# Patient Record
Sex: Male | Born: 1965 | Race: White | Hispanic: No | Marital: Married | Smoking: Heavy tobacco smoker
Health system: Southern US, Community
[De-identification: ages and names within clinical notes are randomized; demographics above are authoritative.]

## PROBLEM LIST (undated history)

## (undated) DIAGNOSIS — T7840XA Allergy, unspecified, initial encounter: Secondary | ICD-10-CM

## (undated) DIAGNOSIS — E785 Hyperlipidemia, unspecified: Secondary | ICD-10-CM

## (undated) DIAGNOSIS — N200 Calculus of kidney: Secondary | ICD-10-CM

## (undated) DIAGNOSIS — K279 Peptic ulcer, site unspecified, unspecified as acute or chronic, without hemorrhage or perforation: Secondary | ICD-10-CM

## (undated) DIAGNOSIS — M2550 Pain in unspecified joint: Secondary | ICD-10-CM

## (undated) DIAGNOSIS — F329 Major depressive disorder, single episode, unspecified: Secondary | ICD-10-CM

## (undated) DIAGNOSIS — E781 Pure hyperglyceridemia: Secondary | ICD-10-CM

## (undated) DIAGNOSIS — F419 Anxiety disorder, unspecified: Secondary | ICD-10-CM

## (undated) HISTORY — DX: Major depressive disorder, single episode, unspecified: F32.9

## (undated) HISTORY — DX: Allergy, unspecified, initial encounter: T78.40XA

## (undated) HISTORY — DX: Anxiety disorder, unspecified: F41.9

## (undated) HISTORY — DX: Pure hyperglyceridemia: E78.1

## (undated) HISTORY — DX: Peptic ulcer, site unspecified, unspecified as acute or chronic, without hemorrhage or perforation: K27.9

## (undated) HISTORY — DX: Hyperlipidemia, unspecified: E78.5

## (undated) HISTORY — DX: Pain in unspecified joint: M25.50

## (undated) HISTORY — DX: Calculus of kidney: N20.0

## (undated) HISTORY — PX: SHOULDER SURGERY: SHX246

---

## 2008-10-04 ENCOUNTER — Emergency Department (HOSPITAL_COMMUNITY): Admission: EM | Admit: 2008-10-04 | Discharge: 2008-10-04 | Payer: Self-pay | Admitting: Emergency Medicine

## 2010-05-10 LAB — URINALYSIS, ROUTINE W REFLEX MICROSCOPIC
Ketones, ur: 15 mg/dL — AB
Nitrite: NEGATIVE
Protein, ur: 100 mg/dL — AB
Urobilinogen, UA: 1 mg/dL (ref 0.0–1.0)

## 2010-05-10 LAB — URINE MICROSCOPIC-ADD ON

## 2010-05-10 LAB — POCT I-STAT, CHEM 8
BUN: 10 mg/dL (ref 6–23)
Chloride: 106 mEq/L (ref 96–112)
Potassium: 3.8 mEq/L (ref 3.5–5.1)
Sodium: 139 mEq/L (ref 135–145)
TCO2: 23 mmol/L (ref 0–100)

## 2010-05-10 LAB — GC/CHLAMYDIA PROBE AMP, GENITAL
Chlamydia, DNA Probe: NEGATIVE
GC Probe Amp, Genital: NEGATIVE

## 2010-12-24 IMAGING — CT CT ABDOMEN W/O CM
2 of 4 series · 14 of 32 positions shown, 19 images · non-contrast
Comparison: None

CT ABDOMEN

CLINICAL DATA: Left flank pain.

CT ABDOMEN AND PELVIS WITHOUT CONTRAST
TECHNIQUE: Multidetector CT imaging of the abdomen and pelvis was
performed following the standard protocol without intravenous
contrast.

[Series 2: stone <(id) w/o a & p (id) · axial · non-contrast · 0.84mm/px · z∈[-509,-169]mm · 8 of 88 slices shown, 13 images]
[im 10/88  soft-tissue]
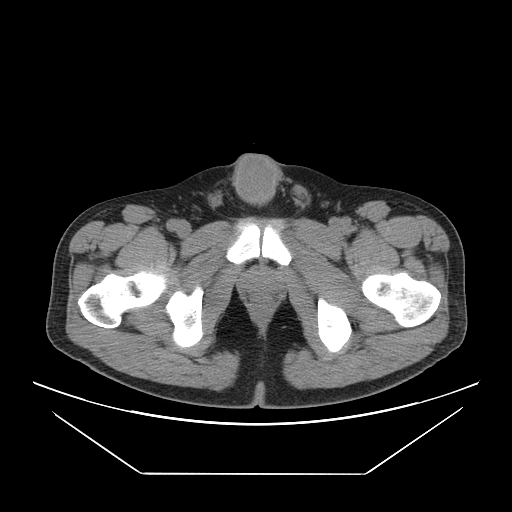
[im 10/88  bone]
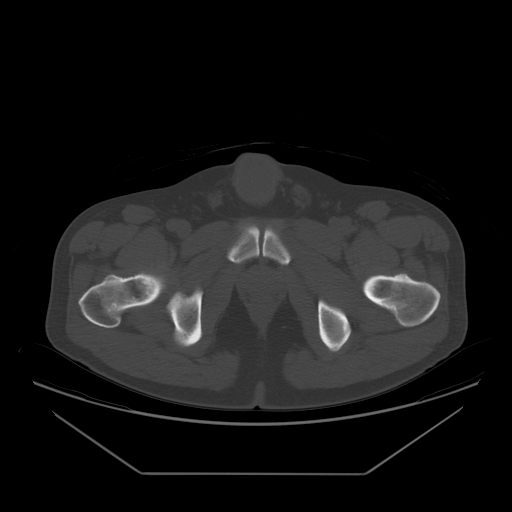
[im 20/88  soft-tissue]
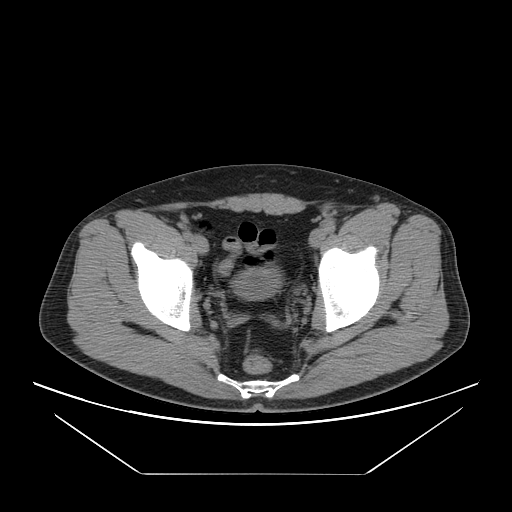
[im 30/88  soft-tissue]
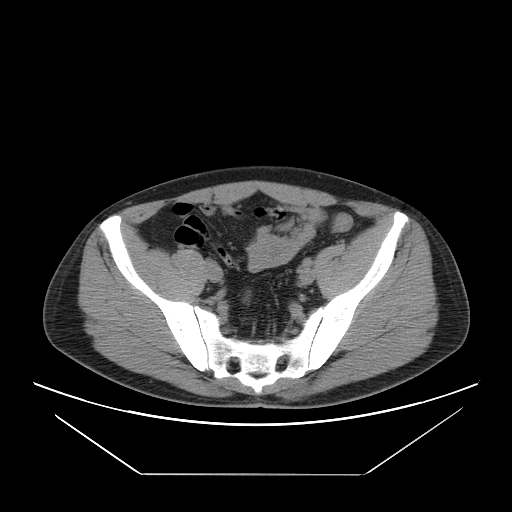
[im 39/88  soft-tissue]
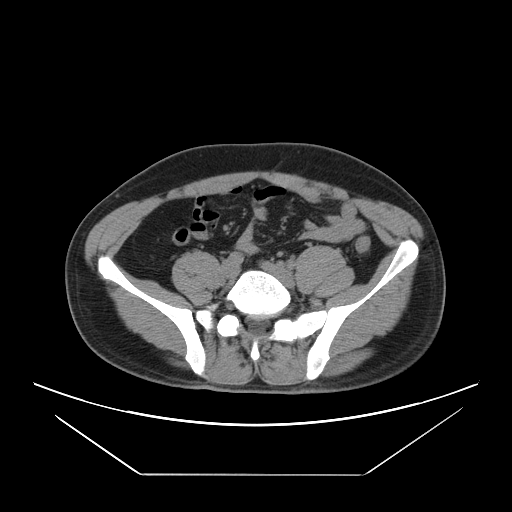
[im 49/88  soft-tissue]
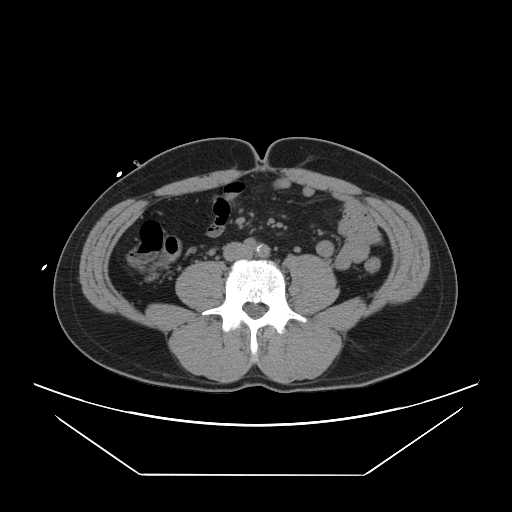
[im 49/88  lung]
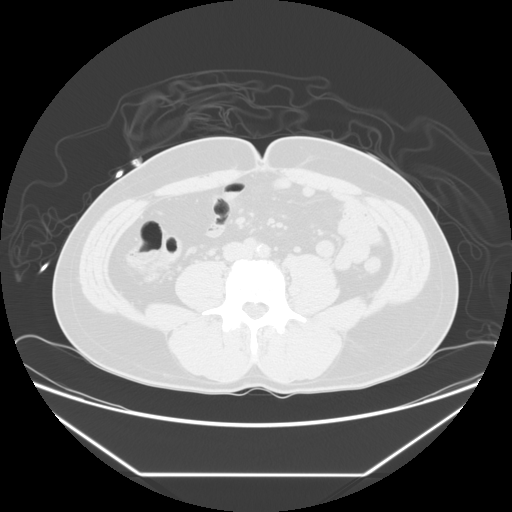
[im 59/88  soft-tissue]
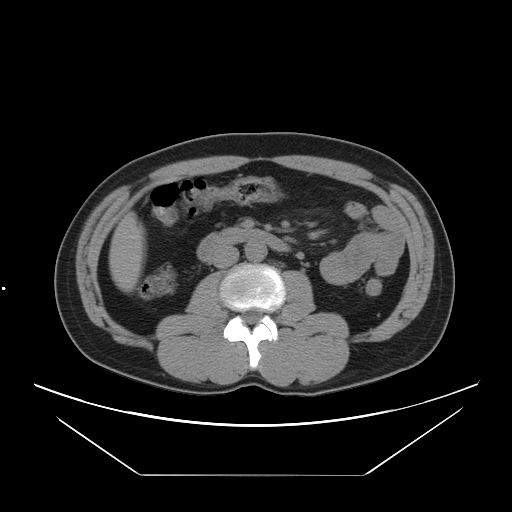
[im 59/88  lung]
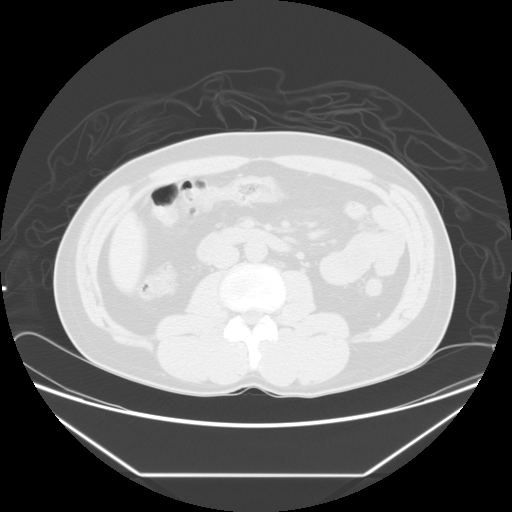
[im 68/88  soft-tissue]
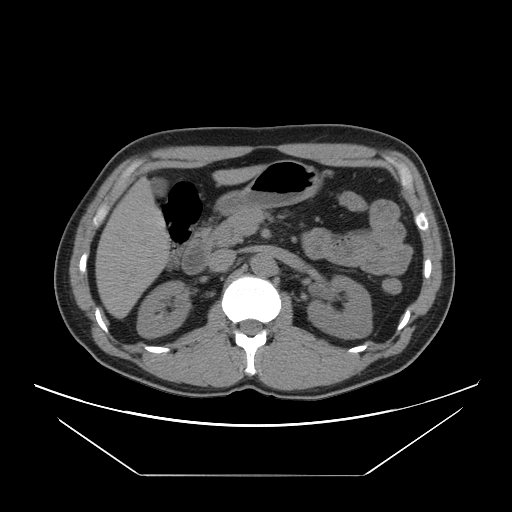
[im 68/88  lung]
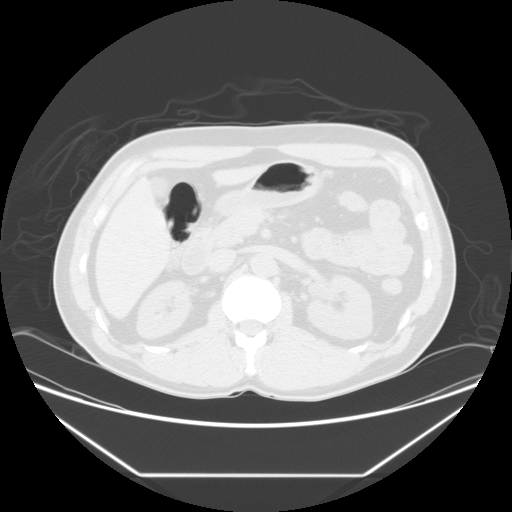
[im 78/88  soft-tissue]
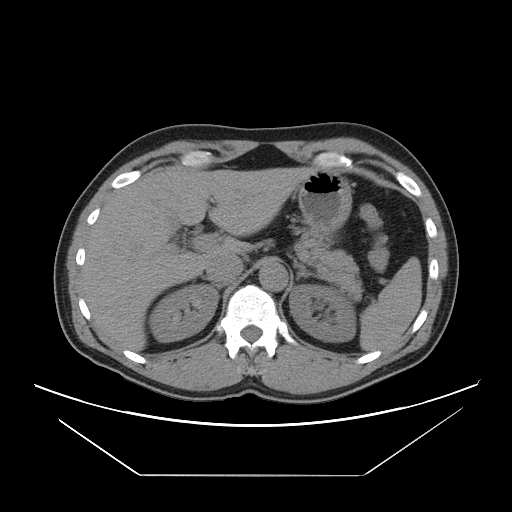
[im 78/88  lung]
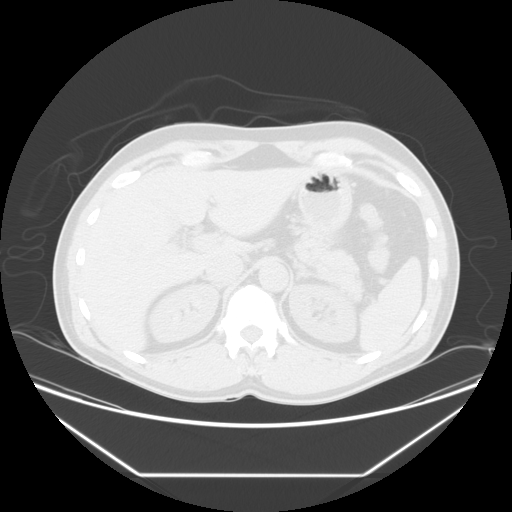

[Series 400: sag a/p · sagittal · 0.86mm/px · 6 of 102 slices shown]
[im 10/102  soft-tissue]
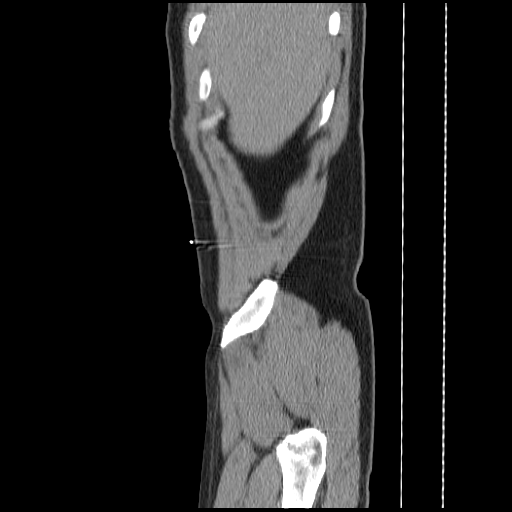
[im 19/102  soft-tissue]
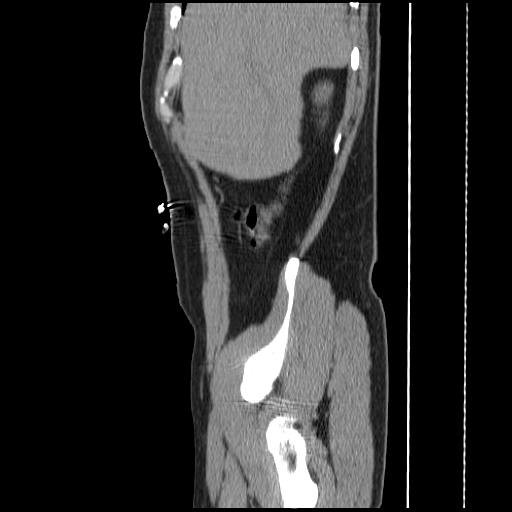
[im 37/102  soft-tissue]
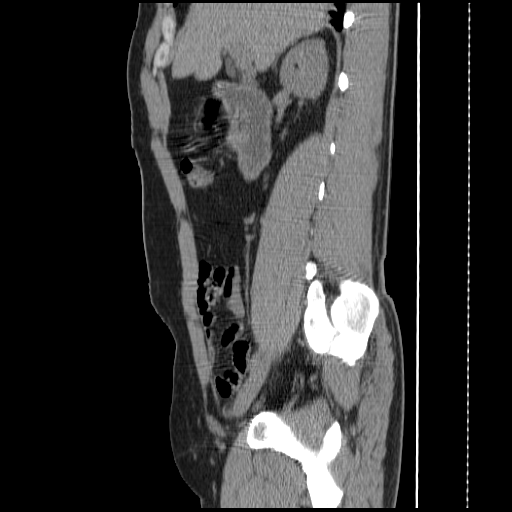
[im 46/102  soft-tissue]
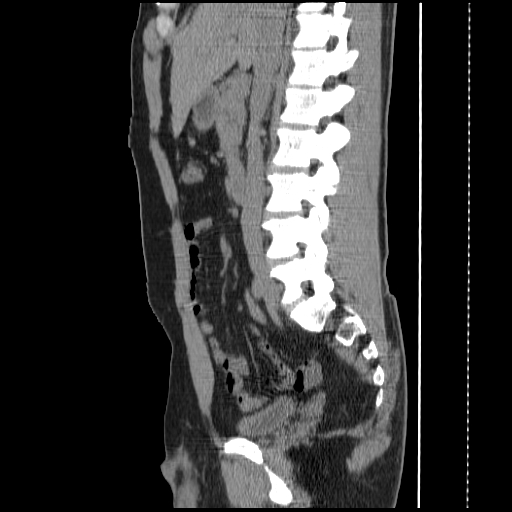
[im 56/102  soft-tissue]
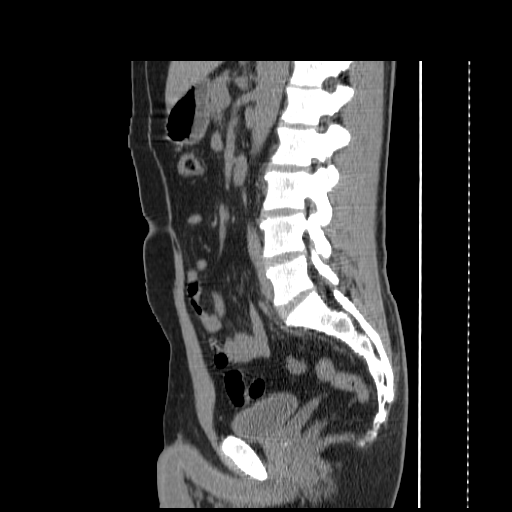
[im 65/102  soft-tissue]
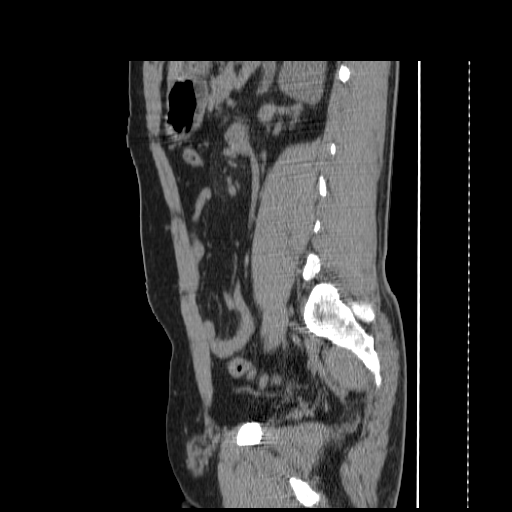

[14 of 32 positions shown; findings below may reference images not displayed]

FINDINGS: The visualized portion of the liver, spleen, pancreas,
and adrenal glands appear unremarkable in noncontrast CT
appearance.

The gallbladder and biliary system appear unremarkable.

There is mild left hydronephrosis and borderline left hydroureter
due to a 1-2 mm left ureterovesical junction calculus as shown on
image 46 of series 401.  This stone is thought to only be partially
obstructive.

No other calculi are identified.  There is some low-level
mesenteric edema eccentric to the left on images 25-37 of series
#2, with scattered small mesenteric lymph nodes.
IMPRESSION: 1.  1-2 mm left ureterovesical junction calculus is partially
obstructive, with borderline left hydronephrosis and borderline
left hydroureter.
2.  Faint mesenteric edema eccentric to the left, nonspecific and
of uncertain significance.

CT PELVIS
FINDINGS: There is a 1-2 mm left ureterovesical junction calculus.

The appendix appears normal.  No right ureteral calculus
identified.

Urinary bladder appears unremarkable.

No pathologic pelvic adenopathy is identified.
IMPRESSION: 1.  1-2 mm left ureterovesical junction calculus.

## 2013-08-16 ENCOUNTER — Telehealth: Payer: Self-pay

## 2013-08-16 ENCOUNTER — Telehealth: Payer: Self-pay | Admitting: Internal Medicine

## 2013-08-16 NOTE — Telephone Encounter (Signed)
Patient Information:  Caller Name: Anthony Woodard  Phone: [phone]  Patient: Anthony Woodard, Anthony Woodard  Gender: Male  DOB: [birth year]  Age: 48 Years  PCP: Willow OraPaz, Jose  Office Follow Up:  Does the office need to follow up with this patient?: No  Instructions For The Office: N/A  RN Note:  [facility]. Anthony Woodard made aware to proceed now to ED. Anthony Woodard states patient has already said he is not going to ED. Education provided regarding cardiac conditions/problems sometimes presenting as a subtle, nagging pain that can be dismissed by patient and/or family. Anthony Woodard appreciative of time and information provided.  Symptoms  Reason For Call & Symptoms: Generalized Joint Pain and Numbness and Tingling in Both Arms/Hands. Spouse reports she suspects anxiety attacks in addition to these symptoms though patient denies.  Reviewed Health History In EMR: Yes  Reviewed Medications In EMR: Yes  Reviewed Allergies In EMR: Yes  Reviewed Surgeries / Procedures: Yes  Date of Onset of Symptoms: Unknown  Treatments Tried: Ibuprofen PRN  Treatments Tried Worked: No  Guideline(s) Used:  Shoulder Pain  Disposition Per Guideline:   Go to ED Now  Reason For Disposition Reached:   Age > 40 and no obvious cause and pain even when not moving the arm (Exception: pain is clearly made worse by moving arm or bending neck)  Advice Given:  N/A  Patient Refused Recommendation:  Patient Will Follow Up With Office Later  Although patient refused advice to be evaluated in ED. Education was provided and ED evaluation was reinforced.

## 2013-08-16 NOTE — Telephone Encounter (Signed)
Spoke with Aundra MilletMegan, CAN who states that the patient has called with complaints of numbness down both arms associated with chest tightness. Agree with CAN to go to ED now.

## 2013-09-29 ENCOUNTER — Telehealth: Payer: Self-pay | Admitting: Internal Medicine

## 2013-09-29 ENCOUNTER — Encounter: Payer: Self-pay | Admitting: Medical

## 2013-09-29 ENCOUNTER — Ambulatory Visit (INDEPENDENT_AMBULATORY_CARE_PROVIDER_SITE_OTHER): Payer: BC Managed Care – PPO | Admitting: Medical

## 2013-09-29 VITALS — BP 120/70 | HR 80 | Temp 98.3°F | Wt 198.0 lb

## 2013-09-29 DIAGNOSIS — R05 Cough: Secondary | ICD-10-CM

## 2013-09-29 DIAGNOSIS — F17200 Nicotine dependence, unspecified, uncomplicated: Secondary | ICD-10-CM | POA: Insufficient documentation

## 2013-09-29 DIAGNOSIS — J40 Bronchitis, not specified as acute or chronic: Secondary | ICD-10-CM

## 2013-09-29 DIAGNOSIS — R062 Wheezing: Secondary | ICD-10-CM

## 2013-09-29 DIAGNOSIS — J0100 Acute maxillary sinusitis, unspecified: Secondary | ICD-10-CM

## 2013-09-29 DIAGNOSIS — R059 Cough, unspecified: Secondary | ICD-10-CM

## 2013-09-29 MED ORDER — ALBUTEROL SULFATE HFA 108 (90 BASE) MCG/ACT IN AERS: 2.0000 | INHALATION_SPRAY | Freq: Four times a day (QID) | RESPIRATORY_TRACT | Status: AC | PRN

## 2013-09-29 MED ORDER — BECLOMETHASONE DIPROPIONATE 40 MCG/ACT IN AERS: 2.0000 | INHALATION_SPRAY | Freq: Two times a day (BID) | RESPIRATORY_TRACT | Status: DC

## 2013-09-29 MED ORDER — AMOXICILLIN-POT CLAVULANATE 875-125 MG PO TABS: 1.0000 | ORAL_TABLET | Freq: Two times a day (BID) | ORAL | Status: DC

## 2013-09-29 MED ORDER — BENZONATATE 100 MG PO CAPS: 100.0000 mg | ORAL_CAPSULE | Freq: Three times a day (TID) | ORAL | Status: DC | PRN

## 2013-09-29 NOTE — Assessment & Plan Note (Signed)
Will get cxr today. Advised on possible copd in future. May already be developing. In near future consider offering agent such as wellbutrin.

## 2013-09-29 NOTE — Telephone Encounter (Signed)
°  Caller name: Coralee North from [employer]  Relation to pt: other  Call back number: [phone]   Reason for call:   pt is currently at [facility] please fax chest x-ray orders to [phone]

## 2013-09-29 NOTE — Assessment & Plan Note (Signed)
Rx of augmentin and benzonatate. cxr at [facility].

## 2013-09-29 NOTE — Patient Instructions (Addendum)
Dx- sinusitis, bronchitis, wheezing.  Plan- Rest hydrate and tylenol for fever.(or ibuprofen).  Antibiotic augmentin. Please stop plain amoxicillin. Benzonatate for cough. 2 inhalers for your wheezing. CXR at the hospital. If your symptoms worsen or change notify us. Please stop smoking. Follow up 7 days any persisting symptoms or as needed.   I later was called by [facility]. Pt went there instead of going to [facility] as instructed. By the time [facility] contact me pt did not want to get xray done. That could be one at a later date on follow up.

## 2013-09-29 NOTE — Progress Notes (Signed)
   Subjective:    Patient ID: Anthony Woodard, male    DOB: 09-Sep-1965, 48 y.o.   MRN: [id number]  HPI  Pt new. See PMH, SH, and PMH in history.  Pt drinks cokes all day long. Drink in his hand all day long. At least 20 oz a day.Maybe 40 oz a day. Wife thinks maybe amount of 2 liter. 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 Rides [employer] and likes to fish.  Pt has chronic smoker cough. But yesterday all sudden worse. Some sneezing and acute sinus pressure and chest congestion. Feels a lot of pnd. Cough is productive. No fever or chills.     Review of Systems  Constitutional: Negative for fever, chills and fatigue.  HENT: Positive for congestion, postnasal drip, rhinorrhea, sinus pressure and sneezing. Negative for ear pain, facial swelling, nosebleeds, sore throat, tinnitus and trouble swallowing.   Respiratory: Positive for cough and wheezing. Negative for chest tightness.   Cardiovascular: Negative for chest pain and palpitations.  Musculoskeletal: Positive for arthralgias.       Not new this is chronic.  Skin: Negative.   Neurological: Negative.   Hematological: Negative for adenopathy. Does not bruise/bleed easily.  Psychiatric/Behavioral: Negative.        Objective:   Physical Exam  General  Mental Status - Alert. General Appearance - Well groomed. Not in acute distress.  Skin Rashes- No Rashes.  HEENT Head- Normal. Ear Auditory Canal - Left- Normal. Right - Normal.Tympanic Membrane- Left- Normal. Right- Normal. Eye Sclera/Conjunctiva- Left- Normal. Right- Normal. Nose & Sinuses Nasal Mucosa- Left-  Boggy and Congested. Right-  Boggy and  Congested. Maxillary sinus pressure.  Mouth & Throat Lips: Upper Lip- Normal: no dryness, cracking, pallor, cyanosis, or vesicular eruption. Lower Lip-Normal: no dryness, cracking, pallor, cyanosis or vesicular eruption.(lt lower teeth poor dentition) Buccal Mucosa- Bilateral- No Aphthous ulcers. Oropharynx- No Discharge or  Erythema. Tonsils: Characteristics- Bilateral- No Erythema or Congestion. Size/Enlargement- Bilateral- No enlargement. Discharge- bilateral-None.  Neck Neck- Supple. No Masses.   Chest and Lung Exam Auscultation: Breath Sounds:-Normal, clear even and unlabored. Mild shallow. Cardiovascular Auscultation:Rythm- Regular.  Murmurs & Other Heart Sounds:Ausculatation of the heart reveal- No Murmurs.  Lymphatic Head & Neck General Head & Neck Lymphatics: Bilateral: Description- No Localized lymphadenopathy.         Assessment & Plan:

## 2013-09-29 NOTE — Assessment & Plan Note (Signed)
Rx qvar and proventil. If he worsens consider brief taper prednisone.

## 2013-09-29 NOTE — Assessment & Plan Note (Signed)
Augmentin rx. Pt can use otc nasal steroid for congestion. May have allergic component to this as well. Avoid otc afrin.

## 2013-09-29 NOTE — Progress Notes (Signed)
Pre visit review using our clinic review tool, if applicable. No additional management support is needed unless otherwise documented below in the visit note. 

## 2013-09-30 NOTE — Telephone Encounter (Signed)
Done

## 2013-10-12 ENCOUNTER — Ambulatory Visit (INDEPENDENT_AMBULATORY_CARE_PROVIDER_SITE_OTHER): Payer: BC Managed Care – PPO | Admitting: Internal Medicine

## 2013-10-12 ENCOUNTER — Ambulatory Visit (HOSPITAL_BASED_OUTPATIENT_CLINIC_OR_DEPARTMENT_OTHER)
Admission: RE | Admit: 2013-10-12 | Discharge: 2013-10-12 | Disposition: A | Discharge status: Home / Self Care | Payer: BC Managed Care – PPO | Source: Ambulatory Visit | Attending: Internal Medicine | Admitting: Internal Medicine

## 2013-10-12 ENCOUNTER — Encounter: Payer: Self-pay | Admitting: Internal Medicine

## 2013-10-12 VITALS — BP 110/70 | HR 83 | Temp 98.2°F | Ht 72.0 in | Wt 200.5 lb

## 2013-10-12 DIAGNOSIS — F17200 Nicotine dependence, unspecified, uncomplicated: Secondary | ICD-10-CM

## 2013-10-12 DIAGNOSIS — R079 Chest pain, unspecified: Secondary | ICD-10-CM | POA: Insufficient documentation

## 2013-10-12 DIAGNOSIS — Z87891 Personal history of nicotine dependence: Secondary | ICD-10-CM | POA: Diagnosis not present

## 2013-10-12 DIAGNOSIS — M2550 Pain in unspecified joint: Secondary | ICD-10-CM

## 2013-10-12 DIAGNOSIS — R0609 Other forms of dyspnea: Secondary | ICD-10-CM

## 2013-10-12 DIAGNOSIS — R0989 Other specified symptoms and signs involving the circulatory and respiratory systems: Secondary | ICD-10-CM

## 2013-10-12 DIAGNOSIS — R0602 Shortness of breath: Secondary | ICD-10-CM | POA: Insufficient documentation

## 2013-10-12 DIAGNOSIS — G8929 Other chronic pain: Secondary | ICD-10-CM

## 2013-10-12 DIAGNOSIS — R0789 Other chest pain: Secondary | ICD-10-CM | POA: Insufficient documentation

## 2013-10-12 DIAGNOSIS — F32A Depression, unspecified: Secondary | ICD-10-CM

## 2013-10-12 DIAGNOSIS — F329 Major depressive disorder, single episode, unspecified: Secondary | ICD-10-CM | POA: Insufficient documentation

## 2013-10-12 DIAGNOSIS — F419 Anxiety disorder, unspecified: Secondary | ICD-10-CM

## 2013-10-12 DIAGNOSIS — F341 Dysthymic disorder: Secondary | ICD-10-CM

## 2013-10-12 HISTORY — DX: Anxiety disorder, unspecified: F41.9

## 2013-10-12 HISTORY — DX: Depression, unspecified: F32.A

## 2013-10-12 MED ORDER — ESCITALOPRAM OXALATE 10 MG PO TABS: 10.0000 mg | ORAL_TABLET | Freq: Every day | ORAL | Status: DC

## 2013-10-12 NOTE — Assessment & Plan Note (Signed)
Smokes 2 packs a day since age 48,  He is extremely high risk for cancer, CAD, strokes and emphysema. Patient is very aware of these risks. Recommend to see a dentist regularly Will treat anxiety and depression first, then consider Wellbutrin Recommend to start thinking in terms of quitting, nicotine patch

## 2013-10-12 NOTE — Assessment & Plan Note (Signed)
Multiple joint pains without evidence of synovitis on exam. DJD? Inflammatory arthritis? Fibromyalgia?  Plan--labs, see instructions

## 2013-10-12 NOTE — Patient Instructions (Signed)
Stop by the front desk and schedule labs to be done tomorrow (fasting) CMP, CBC, TSH, FLP --- Chest pain Sed rate , total CK-- dx joint pain  Stop by the first floor and get your chest x-ray  Medicines  Start aspirin 81 mg one tablet daily Start Lexapro 10 mg: The first 10 days take half tablet  at night, then take one tablet daily Start taking Prilosec 20 mg OTC 2 tablets every morning before breakfast   Please come back to the office in 3 weeks  for a routine office visit

## 2013-10-12 NOTE — Progress Notes (Signed)
Subjective:    Patient ID: Anthony Woodard, male    DOB: July 23, 1965, 48 y.o.   MRN: [id number]  DOS:  10/12/2013 Type of visit - description : new pt to me, here w/ wife, has several issues   arthralgias: States that he hurt in all his joints all the time for the last 15 years. Has not seen any M.D. before for these symptoms, has not noticed any swelling or redness in his wrists or hands. I notice that he is allergic to hydrocodone and is not taking any pain medication at this time. Symptoms gradually worse since he had shoulder surgery a few years ago.  Anxiety: wife is here, she reports that he is very anxious all the time since she met him, "high strung". He gets upset very easily, " carries everything on his shoulders". When asked, admits to occasional depression but no suicidal thoughts. Never taken any medication for his symptoms.  Dyspnea on exertion: For the last 4-5 years he notice DOEon and off, symptoms are not always consistent but sometimes it takes very little for him to get SOB. He smokes 2 pack a day for many years, has a "smoker's cough":  daily morning cough which is dry. At some point in the remote past he saw blood in the sputum one time   but not recently Recently  seen with bronchitis, bronchospasm, status post antibiotic, he is getting better from that acute infection.  When asked, admits to chest pain: He said it is all the time , Sharp, sometimes pressure. Not necessarily worse with exertion.  ROS Denies any fever chills or weight loss. Reports headache "all the time" mild, occasional exacerbation .    Past Medical History  Diagnosis Date  . Kidney stones     Lt side.  . Arthralgia     All over.  . Allergy   . Anxiety and depression 10/12/2013    Past Surgical History  Procedure Laterality Date  . Shoulder surgery Right Biceps tendon tear. And shoulder repair.    History   Social History  . Marital Status: Married    Spouse Name: N/A    Number of  Children: 4  . Years of Education: N/A   Occupational History  . machinery , driver     Social History Main Topics  . Smoking status: Heavy Tobacco Smoker -- 2.00 packs/day for 27 years    Types: Cigarettes  . Smokeless tobacco: Never Used     Comment: started at age 82, 2 ppd   . Alcohol Use: 0.6 oz/week    1 Cans of beer per week     Comment: rare   . Drug Use: No  . Sexual Activity: Yes   Other Topics Concern  . Not on file   Social History Narrative  . No narrative on file     Family History  Problem Relation Age of Onset  . Stroke Father   . CAD Sister     stent at age 39s  . Diabetes Brother   . Colon cancer Neg Hx   . Prostate cancer Neg Hx   . COPD Mother   . Ovarian cancer Sister        Medication List       This list is accurate as of: 10/12/13 11:59 PM.  Always use your most recent med list.               albuterol 108 (90 BASE) MCG/ACT inhaler  Commonly known  as:  PROVENTIL HFA;VENTOLIN HFA  Inhale 2 puffs into the lungs every 6 (six) hours as needed for wheezing or shortness of breath.     escitalopram 10 MG tablet  Commonly known as:  LEXAPRO  Take 1 tablet (10 mg total) by mouth daily.     ibuprofen 800 MG tablet  Commonly known as:  ADVIL,MOTRIN  Take 800 mg by mouth every 8 (eight) hours as needed.     traMADol 50 MG tablet  Commonly known as:  ULTRAM  Take by mouth every 6 (six) hours as needed.           Objective:   Physical Exam BP 110/70  Pulse 83  Temp(Src) 98.2 F (36.8 C) (Oral)  Ht 6' (1.829 m)  Wt 200 lb 8 oz (90.946 kg)  BMI 27.19 kg/m2  SpO2 98% General -- alert, well-developed, NAD.  Neck --no thyromegaly , no  LAD HEENT-- Not pale.  Lungs -- normal respiratory effort, no intercostal retractions, no accessory muscle use, and normal breath sounds.  Heart-- normal rate, regular rhythm, no murmur.  Abdomen-- Not distended, good bowel sounds,soft, non-tender. Extremities-- no pretibial edema bilaterally ;  Wrists and hands without synovitis on exam Neurologic--  alert & oriented X3. Speech normal, gait appropriate for age, strength symmetric and appropriate for age.    Psych-- Cognition and judgment appear intact. Cooperative with normal attention span and concentration. No anxious or depressed appearing, He actually seems to be in good spirits     Assessment & Plan:    48 year old gentleman presents with multiple symptoms, mostly been chronic and happening "all the time".  Also GERD-- start PPI  Today , I spent more than 45   min with the patient: >50% of the time counseling regards anxiety and assesing a number of chronic symptoms. Multiple questions from the patient and his wife were answer

## 2013-10-12 NOTE — Assessment & Plan Note (Addendum)
Long history of anxiety and occasional depression, recommend counseling (declined). We talk about medication, he agreed to try lexapro

## 2013-10-12 NOTE — Assessment & Plan Note (Addendum)
smoker with chronic dry cough or family history of CAD and COPD presents with dyspnea on exertion. Differential diagnosis is large but includes COPD, CAD versus others. Plan:  EKG-- nsr  Chest x-ray Labs including a cholesterol panel for risk stratification PFTs Tobacco cessation Aspirin-- 81 mg 1 qd, watch  GI s/e Stress test, pt somehow reluctant to proceed thus will refer to cards

## 2013-10-12 NOTE — Progress Notes (Signed)
Pre visit review using our clinic review tool, if applicable. No additional management support is needed unless otherwise documented below in the visit note. 

## 2013-10-13 ENCOUNTER — Other Ambulatory Visit (INDEPENDENT_AMBULATORY_CARE_PROVIDER_SITE_OTHER): Payer: BC Managed Care – PPO

## 2013-10-13 ENCOUNTER — Ambulatory Visit: Payer: BC Managed Care – PPO

## 2013-10-13 DIAGNOSIS — R0789 Other chest pain: Secondary | ICD-10-CM

## 2013-10-13 DIAGNOSIS — G8929 Other chronic pain: Secondary | ICD-10-CM

## 2013-10-13 DIAGNOSIS — M2550 Pain in unspecified joint: Secondary | ICD-10-CM

## 2013-10-13 LAB — LIPID PANEL
CHOLESTEROL: 328 mg/dL — AB (ref 0–200)
HDL: 23 mg/dL — ABNORMAL LOW (ref >39)
Total CHOL/HDL Ratio: 14.3 Ratio
Triglycerides: 2508 mg/dL — ABNORMAL HIGH (ref <150)

## 2013-10-13 LAB — CBC WITH DIFFERENTIAL/PLATELET
BASOS PCT: 0.3 % (ref 0.0–3.0)
Basophils Absolute: 0 10*3/uL (ref 0.0–0.1)
EOS ABS: 0.2 10*3/uL (ref 0.0–0.7)
EOS PCT: 1.6 % (ref 0.0–5.0)
HEMATOCRIT: 49.2 % (ref 39.0–52.0)
Hemoglobin: 17.5 g/dL — ABNORMAL HIGH (ref 13.0–17.0)
LYMPHS ABS: 2.4 10*3/uL (ref 0.7–4.0)
Lymphocytes Relative: 21.8 % (ref 12.0–46.0)
MCHC: 35.5 g/dL (ref 30.0–36.0)
MCV: 87 fl (ref 78.0–100.0)
MONO ABS: 0.6 10*3/uL (ref 0.1–1.0)
Monocytes Relative: 5.6 % (ref 3.0–12.0)
NEUTROS PCT: 70.7 % (ref 43.0–77.0)
Neutro Abs: 7.8 10*3/uL — ABNORMAL HIGH (ref 1.4–7.7)
PLATELETS: 213 10*3/uL (ref 150.0–400.0)
RBC: 5.65 Mil/uL (ref 4.22–5.81)
RDW: 13.4 % (ref 11.5–15.5)
WBC: 11 10*3/uL — ABNORMAL HIGH (ref 4.0–10.5)

## 2013-10-13 LAB — COMPLETE METABOLIC PANEL WITH GFR
ALBUMIN: 5 g/dL (ref 3.5–5.2)
ALK PHOS: 111 U/L (ref 39–117)
ALT: 76 U/L — ABNORMAL HIGH (ref 0–53)
AST: 64 U/L — ABNORMAL HIGH (ref 0–37)
BUN: 21 mg/dL (ref 6–23)
CO2: 23 meq/L (ref 19–32)
Calcium: 10.3 mg/dL (ref 8.4–10.5)
Chloride: 100 mEq/L (ref 96–112)
Creat: 1.88 mg/dL — ABNORMAL HIGH (ref 0.50–1.35)
GFR, EST AFRICAN AMERICAN: 48 mL/min — AB
GFR, EST NON AFRICAN AMERICAN: 42 mL/min — AB
GLUCOSE: 81 mg/dL (ref 70–99)
POTASSIUM: 4.6 meq/L (ref 3.5–5.3)
SODIUM: 134 meq/L — AB (ref 135–145)
TOTAL PROTEIN: 8.2 g/dL (ref 6.0–8.3)
Total Bilirubin: 0.7 mg/dL (ref 0.2–1.2)

## 2013-10-13 LAB — TSH: TSH: 1.678 u[IU]/mL (ref 0.350–4.500)

## 2013-10-13 LAB — SEDIMENTATION RATE: SED RATE: 3 mm/h (ref 0–22)

## 2013-10-14 ENCOUNTER — Telehealth: Payer: Self-pay | Admitting: Internal Medicine

## 2013-10-14 DIAGNOSIS — R7989 Other specified abnormal findings of blood chemistry: Secondary | ICD-10-CM

## 2013-10-14 DIAGNOSIS — R945 Abnormal results of liver function studies: Secondary | ICD-10-CM

## 2013-10-14 LAB — CK TOTAL AND CKMB (NOT AT ARMC)
CK TOTAL: 106 U/L (ref 7–232)
CK, MB: <0.7 ng/mL (ref 0.0–5.0)

## 2013-10-14 MED ORDER — FENOFIBRATE 40 MG PO TABS: 40.0000 mg | ORAL_TABLET | Freq: Every day | ORAL | Status: DC

## 2013-10-14 NOTE — Telephone Encounter (Signed)
Advise patient: His kidney function is decreased, liver tests elevated, triglycerides extremity high. In addition to that we have already ordered, needs further evaluation (hemochromatosis ?, MM?) Plan: Come back fasting for additional labs:  Hepatitis  serology , iron, ferritin, transferring saturation   abdominal ultrasound to assess kidney and liver  Fenofibrate, low dose (orders in) RTC 3 weeks as recommended

## 2013-10-17 NOTE — Telephone Encounter (Signed)
Spoke with Pts wife Danford Bad, left my information to have Pt return my call.

## 2013-10-17 NOTE — Telephone Encounter (Signed)
Spoke with Pt, gave him results, lab appt 10/20/2013 at 8:15, instructed Pt to get abdominal ultrasound, which he stated would probably be done on 9/17 also, and instructed him to take fenofibrate .

## 2013-10-19 ENCOUNTER — Other Ambulatory Visit (HOSPITAL_BASED_OUTPATIENT_CLINIC_OR_DEPARTMENT_OTHER): Payer: BC Managed Care – PPO

## 2013-10-19 NOTE — Telephone Encounter (Signed)
Please advise 

## 2013-10-19 NOTE — Telephone Encounter (Signed)
Pt would like for you to call him back in regards to his ultra sound appt, pt states he needs to know if it is a must that he has the procedure done. States he is not going to pay $400 to have the ultra sound states if its not life threatening he does not want to pay that much money.

## 2013-10-20 ENCOUNTER — Ambulatory Visit (HOSPITAL_BASED_OUTPATIENT_CLINIC_OR_DEPARTMENT_OTHER): Payer: BC Managed Care – PPO

## 2013-10-20 ENCOUNTER — Other Ambulatory Visit: Payer: BC Managed Care – PPO

## 2013-10-20 NOTE — Telephone Encounter (Signed)
Caller name: Tylerjames  Call back number: [phone]    Reason for call:   Pt has questions about lab apts, and antibiotics.

## 2013-10-20 NOTE — Telephone Encounter (Signed)
We can hold off on the ultrasound, we can discuss more when he comes back in approximately 2 weeks

## 2013-10-20 NOTE — Telephone Encounter (Signed)
Tried returning Pt call. No answer, no answer machine set up.

## 2013-10-20 NOTE — Telephone Encounter (Signed)
Spoke with Pt, he is concerned that his tooth infection may be causing the elevated lab results. He stated he has been on antibiotics for 4 weeks and does not return to dentist until 10/29. His next appt with you is 9/30. He wanted to know if it would be okay to reschedule 9/30 appt until after 10/29 because of the antibiotic usage and tooth infection.

## 2013-10-21 NOTE — Telephone Encounter (Signed)
Ok, will see him ~ 12-01-13

## 2013-10-21 NOTE — Telephone Encounter (Signed)
LMOM for Pt to return call.  

## 2013-10-21 NOTE — Telephone Encounter (Signed)
Spoke with Pt, rescheduled for F/U on 11/20 at 4 pm.

## 2013-11-02 ENCOUNTER — Ambulatory Visit: Payer: BC Managed Care – PPO | Admitting: Internal Medicine

## 2013-11-03 ENCOUNTER — Ambulatory Visit (INDEPENDENT_AMBULATORY_CARE_PROVIDER_SITE_OTHER): Payer: BC Managed Care – PPO | Admitting: Internal Medicine

## 2013-11-03 DIAGNOSIS — R0609 Other forms of dyspnea: Secondary | ICD-10-CM

## 2013-11-03 LAB — PULMONARY FUNCTION TEST
DL/VA % PRED: 90 %
DL/VA: 4.26 ml/min/mmHg/L
DLCO UNC % PRED: 78 %
DLCO UNC: 26.34 ml/min/mmHg
FEF 25-75 Post: 2.58 L/sec
FEF 25-75 Pre: 1.64 L/sec
FEF2575-%Change-Post: 57 %
FEF2575-%PRED-PRE: 43 %
FEF2575-%Pred-Post: 69 %
FEV1-%Change-Post: 14 %
FEV1-%Pred-Post: 74 %
FEV1-%Pred-Pre: 64 %
FEV1-POST: 3.08 L
FEV1-Pre: 2.69 L
FEV1FVC-%CHANGE-POST: 7 %
FEV1FVC-%Pred-Pre: 85 %
FEV6-%CHANGE-POST: 7 %
FEV6-%PRED-POST: 83 %
FEV6-%PRED-PRE: 77 %
FEV6-POST: 4.28 L
FEV6-PRE: 3.98 L
FEV6FVC-%CHANGE-POST: 0 %
FEV6FVC-%PRED-POST: 102 %
FEV6FVC-%Pred-Pre: 102 %
FVC-%CHANGE-POST: 6 %
FVC-%PRED-POST: 80 %
FVC-%PRED-PRE: 75 %
FVC-POST: 4.29 L
FVC-PRE: 4.02 L
POST FEV6/FVC RATIO: 100 %
PRE FEV1/FVC RATIO: 67 %
Post FEV1/FVC ratio: 72 %
Pre FEV6/FVC Ratio: 99 %
RV % pred: 87 %
RV: 1.77 L
TLC % PRED: 80 %
TLC: 5.76 L

## 2013-11-03 NOTE — Progress Notes (Signed)
PFT done today. 

## 2013-11-21 NOTE — Progress Notes (Signed)
HPI: 48 year old male for evaluation of chest pain and dyspnea. No prior cardiac history. 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 The patient states he has had dyspnea on exertion for approximately 5 years. There is no orthopnea, PND, pedal edema, palpitations or syncope. He also has occasional chest discomfort. It can be in the left or right chest area. It increases after certain foods and lying flat. No radiation. No associated symptoms. He did not have exertional chest pain. He was recently noted to have abnormal renal function, increased liver functions and extremely high cholesterol. Cardiology asked to evaluate.  Current Outpatient Prescriptions  Medication Sig Dispense Refill  . albuterol (PROVENTIL HFA;VENTOLIN HFA) 108 (90 BASE) MCG/ACT inhaler Inhale 2 puffs into the lungs every 6 (six) hours as needed for wheezing or shortness of breath.  1 Inhaler  1  . amoxicillin (AMOXIL) 500 MG capsule Take 1 capsule by mouth daily.      Marland Kitchen. aspirin EC 81 MG tablet Take 81 mg by mouth daily.      Marland Kitchen. escitalopram (LEXAPRO) 10 MG tablet Take 1 tablet (10 mg total) by mouth daily.  30 tablet  1  . ibuprofen (ADVIL,MOTRIN) 800 MG tablet Take 800 mg by mouth every 8 (eight) hours as needed.      Marland Kitchen. omeprazole (PRILOSEC) 20 MG capsule Take 40 mg by mouth daily.       No current facility-administered medications for this visit.    Allergies  Allergen Reactions  . Hydrocodone Other (See Comments)    Alters mental status . Pt states gets mood swings.   Shon Hale. Qvar [Beclomethasone]     Sweats , almost pass out    Past Medical History  Diagnosis Date  . Kidney stones     Lt side.  . Arthralgia     All over.  . Allergy   . Anxiety and depression 10/12/2013    Past Surgical History  Procedure Laterality Date  . Shoulder surgery Right Biceps tendon tear. And shoulder repair.    History   Social History  . Marital Status: Married    Spouse Name: N/A   Number of Children: 4  . Years of Education: N/A   Occupational History  . machinery , driver     Social History Main Topics  . Smoking status: Heavy Tobacco Smoker -- 2.00 packs/day for 27 years    Types: Cigarettes  . Smokeless tobacco: Never Used     Comment: started at age 48, 2 ppd   . Alcohol Use: 0.6 oz/week    1 Cans of beer per week     Comment: rare   . Drug Use: No  . Sexual Activity: Yes   Other Topics Concern  . Not on file   Social History Narrative  . No narrative on file    Family History  Problem Relation Age of Onset  . Stroke Father   . CAD Sister     stent at age 5550s  . Diabetes Brother   . Colon cancer Neg Hx   . Prostate cancer Neg Hx   . COPD Mother   . Ovarian cancer Sister     ROS: no fevers or chills, productive cough, hemoptysis, dysphasia, odynophagia, melena, hematochezia, dysuria, hematuria, rash, seizure activity, orthopnea, PND, pedal edema, claudication. Remaining systems are negative.  Physical Exam:   Blood pressure 130/84, pulse 91, height 6' (1.829 m), weight 200 lb 3.2 oz (90.81 kg).  General:  Well developed/well  nourished in NAD Skin warm/dry Patient not depressed No peripheral clubbing Back-normal HEENT-normal/normal eyelids Neck supple/normal carotid upstroke bilaterally; no bruits; no JVD; no thyromegaly chest - CTA/ normal expansion CV - RRR/normal S1 and S2; no murmurs, rubs or gallops;  PMI nondisplaced Abdomen -NT/ND, no HSM, no mass, + bowel sounds, no bruit 2+ femoral pulses, no bruits Ext-no edema, chords, 2+ DP Neuro-grossly nonfocal  ECG 10/12/2013-sinus rhythm with no ST changes.

## 2013-11-22 ENCOUNTER — Encounter: Payer: Self-pay | Admitting: *Deleted

## 2013-11-22 ENCOUNTER — Encounter: Payer: Self-pay | Admitting: Cardiology

## 2013-11-22 ENCOUNTER — Ambulatory Visit (INDEPENDENT_AMBULATORY_CARE_PROVIDER_SITE_OTHER): Payer: BC Managed Care – PPO | Admitting: Cardiology

## 2013-11-22 VITALS — BP 130/84 | HR 91 | Ht 72.0 in | Wt 200.2 lb

## 2013-11-22 DIAGNOSIS — N289 Disorder of kidney and ureter, unspecified: Secondary | ICD-10-CM

## 2013-11-22 DIAGNOSIS — R7989 Other specified abnormal findings of blood chemistry: Secondary | ICD-10-CM

## 2013-11-22 DIAGNOSIS — R0609 Other forms of dyspnea: Secondary | ICD-10-CM

## 2013-11-22 DIAGNOSIS — E785 Hyperlipidemia, unspecified: Secondary | ICD-10-CM

## 2013-11-22 DIAGNOSIS — R945 Abnormal results of liver function studies: Secondary | ICD-10-CM

## 2013-11-22 DIAGNOSIS — R0789 Other chest pain: Secondary | ICD-10-CM

## 2013-11-22 DIAGNOSIS — F17200 Nicotine dependence, unspecified, uncomplicated: Secondary | ICD-10-CM

## 2013-11-22 DIAGNOSIS — R0600 Dyspnea, unspecified: Secondary | ICD-10-CM

## 2013-11-22 DIAGNOSIS — Z720 Tobacco use: Secondary | ICD-10-CM

## 2013-11-22 NOTE — Assessment & Plan Note (Signed)
Symptoms are atypical and may be related to GI. I will arrange an exercise treadmill for risk stratification.

## 2013-11-22 NOTE — Patient Instructions (Signed)
Your physician recommends that you schedule a follow-up appointment in: AS NEEDED PENDING TEST RESULTS  Your physician has requested that you have an echocardiogram. Echocardiography is a painless test that uses sound waves to create images of your heart. It provides your doctor with information about the size and shape of your heart and how well your heart's chambers and valves are working. This procedure takes approximately one hour. There are no restrictions for this procedure.   Your physician has requested that you have an exercise tolerance test. For further information please visit [email or web address]. Please also follow instruction sheet, as given.    Exercise Stress Electrocardiogram An exercise stress electrocardiogram is a test to check how blood flows to your heart. It is done to find areas of poor blood flow. You will need to walk on a treadmill for this test. The electrocardiogram will record your heartbeat when you are at rest and when you are exercising. BEFORE THE PROCEDURE  Do not have drinks with caffeine or foods with caffeine for 24 hours before the test, or as told by your doctor. This includes coffee, tea (even decaf tea), sodas, chocolate, and cocoa.  Follow your doctor's instructions about eating and drinking before the test.  Ask your doctor what medicines you should or should not take before the test. Take your medicines with water unless told by your doctor not to.  If you use an inhaler, bring it with you to the test.  Bring a snack to eat after the test.  Do not  smoke for 4 hours before the test.  Do not put lotions, powders, creams, or oils on your chest before the test.  Wear comfortable shoes and clothing. PROCEDURE  You will have patches put on your chest. Small areas of your chest may need to be shaved. Wires will be connected to the patches.  Your heart rate will be watched while you are resting and while you are exercising.  You will walk on the  treadmill. The treadmill will slowly get faster to raise your heart rate.  The test will take about 1-2 hours. AFTER THE PROCEDURE  Your heart rate and blood pressure will be watched after the test.  You may return to your normal diet, activities, and medicines or as told by your doctor. Document Released: 07/09/2007 Document Revised: 06/06/2013 Document Reviewed: 09/27/2012 Exit[employer] Patient Information 2015 [employer]. This information is not intended to replace advice given to you by your health care provider. Make sure you discuss any questions you have with your health care provider.   

## 2013-11-22 NOTE — Assessment & Plan Note (Signed)
Most likely there is a contribution from COPD. I will schedule an echocardiogram to assess LV function.

## 2013-11-22 NOTE — Assessment & Plan Note (Signed)
Patient counseled on discontinuing. 

## 2013-11-22 NOTE — Assessment & Plan Note (Signed)
I have encouraged him to followup with primary care for this issue.

## 2013-11-22 NOTE — Assessment & Plan Note (Addendum)
Patient's cholesterol is extremely high. His triglycerides are also high. Primary care is managing this. It may be worthwhile for him to be seen in the lipid clinic given the severity of his labs. I will leave this to her primary care. He may also benefit from a statin.

## 2013-11-22 NOTE — Assessment & Plan Note (Signed)
DC Motrin. Followup primary care.

## 2013-11-23 ENCOUNTER — Encounter: Payer: Self-pay | Admitting: Internal Medicine

## 2013-11-23 ENCOUNTER — Telehealth: Payer: Self-pay | Admitting: Internal Medicine

## 2013-11-23 NOTE — Telephone Encounter (Signed)
Advise patient, Chest x-ray showed normal lungs PFTs showed some obstruction meaning wheezing. Quitting  tobacco and use albuterol as needed is the best thing he can do.

## 2013-11-24 NOTE — Telephone Encounter (Signed)
Pt informed via MyChart of cxr results and PFTs results.

## 2013-12-13 ENCOUNTER — Telehealth (HOSPITAL_COMMUNITY): Payer: Self-pay

## 2013-12-13 NOTE — Telephone Encounter (Signed)
Encounter complete. 

## 2013-12-14 ENCOUNTER — Encounter: Payer: Self-pay | Admitting: Internal Medicine

## 2013-12-15 ENCOUNTER — Telehealth: Payer: Self-pay | Admitting: Internal Medicine

## 2013-12-15 ENCOUNTER — Ambulatory Visit (HOSPITAL_BASED_OUTPATIENT_CLINIC_OR_DEPARTMENT_OTHER)
Admission: RE | Admit: 2013-12-15 | Discharge: 2013-12-15 | Disposition: A | Discharge status: Home / Self Care | Payer: BC Managed Care – PPO | Source: Ambulatory Visit | Attending: Cardiology | Admitting: Cardiology

## 2013-12-15 ENCOUNTER — Ambulatory Visit (HOSPITAL_COMMUNITY)
Admission: RE | Admit: 2013-12-15 | Discharge: 2013-12-15 | Disposition: A | Discharge status: Home / Self Care | Payer: BC Managed Care – PPO | Source: Ambulatory Visit | Attending: Cardiology | Admitting: Cardiology

## 2013-12-15 DIAGNOSIS — R079 Chest pain, unspecified: Secondary | ICD-10-CM | POA: Diagnosis not present

## 2013-12-15 DIAGNOSIS — R0600 Dyspnea, unspecified: Secondary | ICD-10-CM

## 2013-12-15 DIAGNOSIS — I517 Cardiomegaly: Secondary | ICD-10-CM

## 2013-12-15 MED ORDER — ESCITALOPRAM OXALATE 20 MG PO TABS: 20.0000 mg | ORAL_TABLET | Freq: Every day | ORAL | Status: DC

## 2013-12-15 NOTE — Procedures (Signed)
Exercise Treadmill Test  Test  Exercise Tolerance Test Ordering MD: Olga MillersBrian Khadija Thier, MD  Interpreting MD:   Unique Test No: 1  Treadmill:  1  Indication for ETT: chest pain - rule out ischemia  Contraindication to ETT: No   Stress Modality: exercise - treadmill  Cardiac Imaging Performed: non   Protocol: standard Bruce - maximal  Max BP:  182/90  Max MPHR (bpm):  174 85% MPR (bpm):  148  MPHR obtained (bpm): 151 % MPHR obtained: 86  Reached 85% MPHR (min:sec): 9:20 Total Exercise Time (min-sec):9:41  Workload in METS:  11.20 Borg Scale:   Reason ETT Terminated:  dyspnea,  leg fatigue    ST Segment Analysis At Rest: NSR, lateral MI, cannot R/O septal MI With Exercise: no evidence of significant ST depression  Other Information Arrhythmia:  No Angina during ETT:  absent (0) Quality of ETT:  diagnostic  ETT Interpretation:  normal - no evidence of ischemia by ST analysis  Comments: ETT with good exercise tolerance (9:41); normal BP response; no chest pain; no ST changes; negative adequate ETT.  Olga MillersBrian Jakeia Carreras

## 2013-12-15 NOTE — Telephone Encounter (Signed)
Please see patient's email, plan: Increase Lexapro  To 20 mg one tablet by mouth daily #30 no refills. Schedule office visit within 2 weeks

## 2013-12-15 NOTE — Progress Notes (Signed)
2D Echocardiogram Complete.  12/15/2013   Annleigh Knueppel, RDCS  

## 2013-12-15 NOTE — Telephone Encounter (Signed)
Spoke with Pt informed him to increase Lexapro to 20 mg 1 tab po qd. Pt has F/U appt scheduled for 11/20 at 4. [facility]

## 2013-12-23 ENCOUNTER — Encounter: Payer: Self-pay | Admitting: Internal Medicine

## 2013-12-23 ENCOUNTER — Ambulatory Visit (INDEPENDENT_AMBULATORY_CARE_PROVIDER_SITE_OTHER): Payer: BC Managed Care – PPO | Admitting: Internal Medicine

## 2013-12-23 VITALS — BP 124/82 | HR 74 | Temp 97.8°F | Wt 203.5 lb

## 2013-12-23 DIAGNOSIS — R7989 Other specified abnormal findings of blood chemistry: Secondary | ICD-10-CM

## 2013-12-23 DIAGNOSIS — N289 Disorder of kidney and ureter, unspecified: Secondary | ICD-10-CM

## 2013-12-23 DIAGNOSIS — F419 Anxiety disorder, unspecified: Secondary | ICD-10-CM

## 2013-12-23 DIAGNOSIS — K219 Gastro-esophageal reflux disease without esophagitis: Secondary | ICD-10-CM

## 2013-12-23 DIAGNOSIS — E785 Hyperlipidemia, unspecified: Secondary | ICD-10-CM

## 2013-12-23 DIAGNOSIS — G8929 Other chronic pain: Secondary | ICD-10-CM

## 2013-12-23 DIAGNOSIS — F329 Major depressive disorder, single episode, unspecified: Secondary | ICD-10-CM

## 2013-12-23 DIAGNOSIS — R945 Abnormal results of liver function studies: Secondary | ICD-10-CM

## 2013-12-23 DIAGNOSIS — M2550 Pain in unspecified joint: Secondary | ICD-10-CM

## 2013-12-23 DIAGNOSIS — R0609 Other forms of dyspnea: Secondary | ICD-10-CM

## 2013-12-23 MED ORDER — TIOTROPIUM BROMIDE MONOHYDRATE 18 MCG IN CAPS: 18.0000 ug | ORAL_CAPSULE | Freq: Every day | RESPIRATORY_TRACT | Status: DC

## 2013-12-23 MED ORDER — RANITIDINE HCL 300 MG PO TABS: 300.0000 mg | ORAL_TABLET | Freq: Every day | ORAL | Status: DC

## 2013-12-23 MED ORDER — FENOFIBRATE 40 MG PO TABS: 40.0000 mg | ORAL_TABLET | Freq: Every day | ORAL | Status: DC

## 2013-12-23 NOTE — Progress Notes (Signed)
Subjective:    Patient ID: Anthony Woodard, male    DOB: 1965-02-20, 48 y.o.   MRN: [id number]  DOS:  12/23/2013 Type of visit - description : f/u , here with his wife Interval history: Since the last visit, he has seen cardiology, had a number of tests and blood work GERD, on PPIs, feels overall better, no further chest pain Had a stress test and echocardiogram--- normal Anxiety depression, Lexapro 10 mg did not  work, increased to 20. Feels about the same, wife states that he may be slightly less anxious. Joint aches unchanged Had PFTs, mild obstruction, still short of breath Triglycerides quite elevated, initially was reluctant to take fenofibrate right but now is taking then after he saw cardiology. Abnormal LFTs and creatinine: See assessment and plan   ROS Denies nausea, vomiting, diarrhea but since the last time he was here he has an increased number of bowel movements, stools are slightly loose. No blood in the stools Still smokes and wheezing on and off.  Past Medical History  Diagnosis Date  . Kidney stones     Lt side.  . Arthralgia     All over.  . Allergy   . Anxiety and depression 10/12/2013  . Hypertriglyceridemia   . Hyperlipidemia   . PUD (peptic ulcer disease)     Past Surgical History  Procedure Laterality Date  . Shoulder surgery Right Biceps tendon tear. And shoulder repair.    History   Social History  . Marital Status: Married    Spouse Name: N/A    Number of Children: 4  . Years of Education: N/A   Occupational History  . machinery , driver     Social History Main Topics  . Smoking status: Heavy Tobacco Smoker -- 2.00 packs/day for 27 years    Types: Cigarettes  . Smokeless tobacco: Never Used     Comment: started at age 48, 2 ppd   . Alcohol Use: 0.6 oz/week    1 Cans of beer per week     Comment: rare   . Drug Use: No  . Sexual Activity: Yes   Other Topics Concern  . Not on file   Social History Narrative        Medication  List       This list is accurate as of: 12/23/13 11:59 PM.  Always use your most recent med list.               albuterol 108 (90 BASE) MCG/ACT inhaler  Commonly known as:  PROVENTIL HFA;VENTOLIN HFA  Inhale 2 puffs into the lungs every 6 (six) hours as needed for wheezing or shortness of breath.     aspirin EC 81 MG tablet  Take 81 mg by mouth daily.     escitalopram 20 MG tablet  Commonly known as:  LEXAPRO  Take 1 tablet (20 mg total) by mouth daily.     Fenofibrate 40 MG Tabs  Take 1 tablet (40 mg total) by mouth daily.     ranitidine 300 MG tablet  Commonly known as:  ZANTAC  Take 1 tablet (300 mg total) by mouth at bedtime.     tiotropium 18 MCG inhalation capsule  Commonly known as:  SPIRIVA HANDIHALER  Place 1 capsule (18 mcg total) into inhaler and inhale daily.           Objective:   Physical Exam BP 124/82 mmHg  Pulse 74  Temp(Src) 97.8 F (36.6 C) (Oral)  Wt  203 lb 8 oz (92.307 kg)  SpO2 97%  General -- alert, well-developed, NAD.  Lungs -- normal respiratory effort, no intercostal retractions, no accessory muscle use, and + scattered wheezes.  Heart-- normal rate, regular rhythm, no murmur. Extremities-- no pretibial edema bilaterally  Neurologic--  alert & oriented X3. Speech normal, gait appropriate for age, strength symmetric and appropriate for age.  Psych-- Cognition and judgment appear intact. Cooperative with normal attention span and concentration. No anxious or depressed appearing.       Assessment & Plan:

## 2013-12-23 NOTE — Progress Notes (Signed)
Pre visit review using our clinic review tool, if applicable. No additional management support is needed unless otherwise documented below in the visit note. 

## 2013-12-23 NOTE — Patient Instructions (Signed)
Stop by the front desk and schedule labs to be done within few days (fasting)  Stop omeprazole, start Zantac 300 milligrams at bedtime for acid reflux Start Spiriva once daily and continue using albuterol as needed  Next visit in 2 months

## 2013-12-24 DIAGNOSIS — K219 Gastro-esophageal reflux disease without esophagitis: Secondary | ICD-10-CM | POA: Insufficient documentation

## 2013-12-24 NOTE — Assessment & Plan Note (Signed)
Extremely high triglycerides, risk of CAD a pancreatitis discussed, on a low dose of fenofibrate, labs.

## 2013-12-24 NOTE — Assessment & Plan Note (Signed)
GERD, on PPIs, doing better but apparently has developed GI side effects (increase frequency of BMs, stools are slightly loose) This could be related to SSRIs were PPIs Plan: DC PPIs, start Zantac, reassess on return to the office

## 2013-12-24 NOTE — Assessment & Plan Note (Signed)
Lexapro 10 mg did not make much of a difference, dose increased to 20, not feeling much better although wife thinks he is a slightly less anxious. Plan: Reassess and return to the office, continue with Lexapro 20 mg

## 2013-12-24 NOTE — Assessment & Plan Note (Signed)
Sedimentation rate and total CK is normal, refer to rheumatology noting that LFTs and creat are also increased

## 2013-12-24 NOTE — Assessment & Plan Note (Signed)
Renal failure, Since the last visit has a stop Motrin, recheck a BMP

## 2013-12-24 NOTE — Assessment & Plan Note (Signed)
DOE Echo, stress test negative PFTs show moderate obstruction, patient is a smoker, symptoms likely related to tobacco-copd. Plan: Add Spiriva, continue albuterol, again recommend to discontinue tobacco

## 2013-12-24 NOTE — Assessment & Plan Note (Signed)
  Increase LFTs, does not take Tylenol or alcohol, recheck LFTs in addition to other labs: Hepatitis serology , iron, ferritin, transferring saturation

## 2013-12-27 ENCOUNTER — Telehealth: Payer: Self-pay | Admitting: Internal Medicine

## 2013-12-27 ENCOUNTER — Other Ambulatory Visit (INDEPENDENT_AMBULATORY_CARE_PROVIDER_SITE_OTHER): Payer: BC Managed Care – PPO

## 2013-12-27 DIAGNOSIS — R945 Abnormal results of liver function studies: Secondary | ICD-10-CM

## 2013-12-27 DIAGNOSIS — R7989 Other specified abnormal findings of blood chemistry: Secondary | ICD-10-CM | POA: Diagnosis not present

## 2013-12-27 DIAGNOSIS — N289 Disorder of kidney and ureter, unspecified: Secondary | ICD-10-CM | POA: Diagnosis not present

## 2013-12-27 DIAGNOSIS — E785 Hyperlipidemia, unspecified: Secondary | ICD-10-CM

## 2013-12-27 LAB — COMPREHENSIVE METABOLIC PANEL
ALBUMIN: 4.9 g/dL (ref 3.5–5.2)
ALT: 37 U/L (ref 0–53)
AST: 28 U/L (ref 0–37)
Alkaline Phosphatase: 114 U/L (ref 39–117)
BUN: 15 mg/dL (ref 6–23)
CALCIUM: 9.7 mg/dL (ref 8.4–10.5)
CO2: 25 meq/L (ref 19–32)
Chloride: 99 mEq/L (ref 96–112)
Creatinine, Ser: 1.1 mg/dL (ref 0.4–1.5)
GFR: 73.23 mL/min (ref >60.00)
Glucose, Bld: 92 mg/dL (ref 70–99)
Potassium: 4.1 mEq/L (ref 3.5–5.1)
Sodium: 137 mEq/L (ref 135–145)
Total Bilirubin: 0.7 mg/dL (ref 0.2–1.2)
Total Protein: 8.5 g/dL — ABNORMAL HIGH (ref 6.0–8.3)

## 2013-12-27 LAB — LIPID PANEL
Cholesterol: 299 mg/dL — ABNORMAL HIGH (ref 0–200)
HDL: 28 mg/dL — ABNORMAL LOW (ref >39.00)
NonHDL: 271
Total CHOL/HDL Ratio: 11
Triglycerides: 729 mg/dL — ABNORMAL HIGH (ref 0.0–149.0)
VLDL: 145.8 mg/dL — ABNORMAL HIGH (ref 0.0–40.0)

## 2013-12-27 LAB — TRANSFERRIN: TRANSFERRIN: 286.6 mg/dL (ref 212.0–360.0)

## 2013-12-27 LAB — HEPATITIS B SURFACE ANTIGEN: HEP B S AG: NEGATIVE

## 2013-12-27 LAB — HEPATITIS C ANTIBODY: HCV Ab: NEGATIVE

## 2013-12-27 LAB — FERRITIN: Ferritin: 184.3 ng/mL (ref 22.0–322.0)

## 2013-12-27 LAB — HEPATITIS B SURFACE ANTIBODY, QUANTITATIVE: HEPATITIS B-POST: 0 m[IU]/mL

## 2013-12-27 NOTE — Telephone Encounter (Signed)
emmi mailed  °

## 2013-12-28 LAB — LDL CHOLESTEROL, DIRECT: Direct LDL: 107.6 mg/dL

## 2014-01-09 ENCOUNTER — Other Ambulatory Visit: Payer: Self-pay

## 2014-01-10 ENCOUNTER — Encounter: Payer: Self-pay | Admitting: Internal Medicine

## 2014-01-10 ENCOUNTER — Telehealth: Payer: Self-pay | Admitting: Internal Medicine

## 2014-01-10 NOTE — Telephone Encounter (Signed)
Advise patient I review his recent labs: Kidney function and liver tests back to normal. Triglycerides have decreased from 2500 to 729, still high but better. Hepatitis serology, iron, ferritin normal. In summary these are good results Increase fenofibrate 40 mg to 2 tablets a day Follow-up as planned in about 2 months

## 2014-01-11 ENCOUNTER — Other Ambulatory Visit: Payer: Self-pay

## 2014-01-11 MED ORDER — FENOFIBRATE 40 MG PO TABS: 2.0000 | ORAL_TABLET | Freq: Every day | ORAL | Status: DC

## 2014-01-11 NOTE — Telephone Encounter (Signed)
Pt sent MyChart message regarding lab results, informed him of results via MyChart, instructed him to increase Fenofibrate to 2 tablets daily, sent to [facility] on [address]. Instructed him to give call if he has any questions.

## 2014-01-15 ENCOUNTER — Other Ambulatory Visit: Payer: Self-pay | Admitting: Internal Medicine

## 2014-02-24 ENCOUNTER — Ambulatory Visit: Payer: Self-pay | Admitting: Internal Medicine

## 2014-03-06 ENCOUNTER — Encounter: Payer: Self-pay | Admitting: Internal Medicine

## 2014-03-07 ENCOUNTER — Other Ambulatory Visit: Payer: Self-pay | Admitting: Internal Medicine

## 2014-03-07 MED ORDER — TRAMADOL HCL 50 MG PO TABS: 50.0000 mg | ORAL_TABLET | Freq: Three times a day (TID) | ORAL | Status: DC | PRN

## 2014-03-07 MED ORDER — FENOFIBRATE 40 MG PO TABS: 2.0000 | ORAL_TABLET | Freq: Every day | ORAL | Status: DC

## 2014-03-07 MED ORDER — CYCLOBENZAPRINE HCL 10 MG PO TABS
10.0000 mg | ORAL_TABLET | Freq: Every evening | ORAL | Status: DC | PRN
Start: 2014-03-07 — End: 2014-06-02

## 2014-03-07 NOTE — Telephone Encounter (Signed)
[facility]

## 2014-03-17 ENCOUNTER — Other Ambulatory Visit: Payer: Self-pay

## 2014-03-17 ENCOUNTER — Encounter: Payer: Self-pay | Admitting: Internal Medicine

## 2014-03-17 ENCOUNTER — Ambulatory Visit (INDEPENDENT_AMBULATORY_CARE_PROVIDER_SITE_OTHER): Payer: Managed Care, Other (non HMO) | Admitting: Internal Medicine

## 2014-03-17 VITALS — BP 136/96 | HR 82 | Temp 98.1°F | Ht 72.0 in | Wt 210.1 lb

## 2014-03-17 DIAGNOSIS — N289 Disorder of kidney and ureter, unspecified: Secondary | ICD-10-CM

## 2014-03-17 DIAGNOSIS — M2550 Pain in unspecified joint: Secondary | ICD-10-CM

## 2014-03-17 DIAGNOSIS — F418 Other specified anxiety disorders: Secondary | ICD-10-CM

## 2014-03-17 DIAGNOSIS — E785 Hyperlipidemia, unspecified: Secondary | ICD-10-CM

## 2014-03-17 DIAGNOSIS — F419 Anxiety disorder, unspecified: Principal | ICD-10-CM

## 2014-03-17 DIAGNOSIS — G8929 Other chronic pain: Secondary | ICD-10-CM

## 2014-03-17 DIAGNOSIS — F329 Major depressive disorder, single episode, unspecified: Secondary | ICD-10-CM

## 2014-03-17 DIAGNOSIS — K219 Gastro-esophageal reflux disease without esophagitis: Secondary | ICD-10-CM

## 2014-03-17 DIAGNOSIS — R0609 Other forms of dyspnea: Secondary | ICD-10-CM

## 2014-03-17 MED ORDER — PANTOPRAZOLE SODIUM 40 MG PO TBEC: 40.0000 mg | DELAYED_RELEASE_TABLET | Freq: Every day | ORAL | Status: DC

## 2014-03-17 MED ORDER — FLUOXETINE HCL 20 MG PO TABS: 30.0000 mg | ORAL_TABLET | Freq: Every day | ORAL | Status: DC

## 2014-03-17 NOTE — Patient Instructions (Addendum)
Stop Lexapro  Start fluoxetine 20 mg one tablet daily In  2 weeks, increase fluoxetine to 1.5 tablets daily  Schedule labs to be done next week  Take Flexeril as needed for pain, minimize the use of Ultram  Next visit in 2 or 3 months

## 2014-03-17 NOTE — Assessment & Plan Note (Signed)
Last triglyceride better

## 2014-03-17 NOTE — Assessment & Plan Note (Addendum)
No clear-cut diagnosis after he saw rheumatology. Shoulder pains better, still having knee pain. Currently on flexeril   and tramadol. Was not recommended a follow-up appointment by rheumatology Plan:  RF medications prn, minimize ultram use ; reassess in 3 months

## 2014-03-17 NOTE — Assessment & Plan Note (Signed)
Recheck labs next week, lab closed  today

## 2014-03-17 NOTE — Progress Notes (Signed)
Pre visit review using our clinic review tool, if applicable. No additional management support is needed unless otherwise documented below in the visit note. 

## 2014-03-17 NOTE — Assessment & Plan Note (Signed)
Overall better but has become very sleepy, will transition from Lexapro to Prozac. If not better will   consider Cymbalta

## 2014-03-17 NOTE — Assessment & Plan Note (Signed)
I'm getting somewhat contradictory information, see HPI. He is not taking the Spiriva mostly due to cost and not feeling any subjective improvement. Plan: Reassess in 3 months

## 2014-03-17 NOTE — Assessment & Plan Note (Signed)
Ongoing nocturnal symptoms, continue with diarrhea even after PPIs were stopped. Plan: Protonix in the morning, Zantac at night, GI referral

## 2014-03-17 NOTE — Progress Notes (Signed)
Subjective:    Patient ID: Anthony Woodard, male    DOB: 11/17/1965, 49 y.o.   MRN: [id number]  DOS:  03/17/2014 Type of visit - description :  Interval history:follow-up on several issues Renal insufficiency, last BMP was satisfactory Dyspnea on exertion, not taking the Spiriva, did not not see it helped and it was expensive. Initially he told me that he was better, at the end of the interview he told me that he is still short of breath. Hyperlipidemia, good compliance w/ medication Tobacco abuse, still smoking a pack a day GERD, diarrhea: Continue with loose stools, initially told  me "I'm much better", later on he said he still has acid reflux at night Increase LFTs, follow-up labs were negative. Pain: Had generalized aches, the shoulder pain is better, still hurting at the knees. Saw rheumatology, notes reviewed  Anxiety, overall better however but med cause him to be very sleepy and "shaky inside". History taking is difficult, sometimes I get contradictory information, see above.  Review of Systems   Past Medical History  Diagnosis Date  . Kidney stones     Lt side.  . Arthralgia     All over.  . Allergy   . Anxiety and depression 10/12/2013  . Hypertriglyceridemia   . Hyperlipidemia   . PUD (peptic ulcer disease)     Past Surgical History  Procedure Laterality Date  . Shoulder surgery Right Biceps tendon tear. And shoulder repair.    History   Social History  . Marital Status: Married    Spouse Name: N/A  . Number of Children: 4  . Years of Education: N/A   Occupational History  . machinery , driver     Social History Main Topics  . Smoking status: Heavy Tobacco Smoker -- 2.00 packs/day for 27 years    Types: Cigarettes  . Smokeless tobacco: Never Used     Comment: started at age 35, 2 ppd   . Alcohol Use: 0.6 oz/week    1 Cans of beer per week     Comment: rare   . Drug Use: No  . Sexual Activity: Yes   Other Topics Concern  . Not on file   Social  History Narrative        Medication List       This list is accurate as of: 03/17/14 11:59 PM.  Always use your most recent med list.               albuterol 108 (90 BASE) MCG/ACT inhaler  Commonly known as:  PROVENTIL HFA;VENTOLIN HFA  Inhale 2 puffs into the lungs every 6 (six) hours as needed for wheezing or shortness of breath.     aspirin EC 81 MG tablet  Take 81 mg by mouth daily.     cyclobenzaprine 10 MG tablet  Commonly known as:  FLEXERIL  Take 1 tablet (10 mg total) by mouth at bedtime as needed for muscle spasms.     Fenofibrate 40 MG Tabs  Take 2 tablets (80 mg total) by mouth daily.     FLUoxetine 20 MG tablet  Commonly known as:  PROZAC  Take 1.5 tablets (30 mg total) by mouth daily.     pantoprazole 40 MG tablet  Commonly known as:  PROTONIX  Take 1 tablet (40 mg total) by mouth daily.     ranitidine 300 MG tablet  Commonly known as:  ZANTAC  Take 1 tablet (300 mg total) by mouth at bedtime.  traMADol 50 MG tablet  Commonly known as:  ULTRAM  Take 1 tablet (50 mg total) by mouth every 8 (eight) hours as needed.           Objective:   Physical Exam  Constitutional: He appears well-developed and well-nourished.  Cardiovascular: Normal rate, regular rhythm, normal heart sounds and intact distal pulses.   No murmur heard. Pulmonary/Chest: Effort normal and breath sounds normal. No respiratory distress. He has no wheezes. He has no rales. He exhibits no tenderness.  Musculoskeletal:  Hands-wrists  without synovitis  Skin: He is not diaphoretic.        Assessment & Plan:   Today , I spent more than  25  min with the patient: >50% of the time reviewing his labs and counseling about results. Also trying to obtain an accurate history which is a challenge.    Problem List Items Addressed This Visit      Digestive   GERD (gastroesophageal reflux disease)    Ongoing nocturnal symptoms, continue with diarrhea even after PPIs were  stopped. Plan: Protonix in the morning, Zantac at night, GI referral      Relevant Medications   pantoprazole (PROTONIX) EC tablet   Other Relevant Orders   Ambulatory referral to Gastroenterology     Genitourinary   Renal insufficiency    Recheck labs next week, lab closed  today      Relevant Orders   Comprehensive metabolic panel     Other   Anxiety and depression - Primary    Overall better but has become very sleepy, will transition from Lexapro to Prozac. If not better will   consider Cymbalta      DOE (dyspnea on exertion)    I'm getting somewhat contradictory information, see HPI. He is not taking the Spiriva mostly due to cost and not feeling any subjective improvement. Plan: Reassess in 3 months      Hyperlipidemia    Last triglyceride better      Chronic joint pain    No clear-cut diagnosis after he saw rheumatology. Shoulder pains better, still having knee pain. Currently on flexeril   and tramadol. Was not recommended a follow-up appointment by rheumatology Plan:  RF medications prn, minimize ultram use ; reassess in 3 months

## 2014-03-20 ENCOUNTER — Encounter: Payer: Self-pay | Admitting: Nurse Practitioner

## 2014-03-24 ENCOUNTER — Ambulatory Visit: Payer: Managed Care, Other (non HMO) | Admitting: Nurse Practitioner

## 2014-03-28 ENCOUNTER — Encounter: Payer: Self-pay | Admitting: Internal Medicine

## 2014-03-28 ENCOUNTER — Other Ambulatory Visit: Payer: Self-pay | Admitting: Internal Medicine

## 2014-03-28 MED ORDER — ZOLPIDEM TARTRATE 10 MG PO TABS: 10.0000 mg | ORAL_TABLET | Freq: Every evening | ORAL | Status: DC | PRN

## 2014-04-03 ENCOUNTER — Encounter: Payer: Self-pay | Admitting: Internal Medicine

## 2014-04-03 ENCOUNTER — Other Ambulatory Visit: Payer: Self-pay

## 2014-04-03 ENCOUNTER — Telehealth: Payer: Self-pay

## 2014-04-03 DIAGNOSIS — R0609 Other forms of dyspnea: Principal | ICD-10-CM

## 2014-04-03 MED ORDER — CLONAZEPAM 0.5 MG PO TABS
0.5000 mg | ORAL_TABLET | Freq: Two times a day (BID) | ORAL | Status: DC | PRN
Start: 2014-04-03 — End: 2014-05-02

## 2014-04-03 NOTE — Telephone Encounter (Signed)
Rx faxed to [facility].  

## 2014-04-03 NOTE — Telephone Encounter (Signed)
Rx printed, awaiting signature by Dr. Paz.  

## 2014-04-03 NOTE — Telephone Encounter (Signed)
-----   Message from Wanda PlumpJose E Paz, MD sent at 04/03/2014  1:01 PM EST ----- Regarding: clonazapam Clonazepam 0.5 mg 1 by mouth twice a day when necessary #40, no refills. Please print and then fax the prescription

## 2014-04-13 ENCOUNTER — Institutional Professional Consult (permissible substitution): Payer: Managed Care, Other (non HMO) | Admitting: Internal Medicine

## 2014-04-24 ENCOUNTER — Ambulatory Visit (INDEPENDENT_AMBULATORY_CARE_PROVIDER_SITE_OTHER): Payer: Managed Care, Other (non HMO) | Admitting: Internal Medicine

## 2014-04-24 ENCOUNTER — Encounter: Payer: Self-pay | Admitting: Internal Medicine

## 2014-04-24 VITALS — BP 128/68 | HR 103 | Temp 98.0°F | Ht 72.0 in | Wt 219.2 lb

## 2014-04-24 DIAGNOSIS — F329 Major depressive disorder, single episode, unspecified: Secondary | ICD-10-CM

## 2014-04-24 DIAGNOSIS — F32A Depression, unspecified: Secondary | ICD-10-CM

## 2014-04-24 DIAGNOSIS — F419 Anxiety disorder, unspecified: Principal | ICD-10-CM

## 2014-04-24 DIAGNOSIS — F418 Other specified anxiety disorders: Secondary | ICD-10-CM

## 2014-04-24 DIAGNOSIS — G8929 Other chronic pain: Secondary | ICD-10-CM

## 2014-04-24 DIAGNOSIS — M2550 Pain in unspecified joint: Secondary | ICD-10-CM

## 2014-04-24 NOTE — Progress Notes (Signed)
Pre visit review using our clinic review tool, if applicable. No additional management support is needed unless otherwise documented below in the visit note. 

## 2014-04-24 NOTE — Assessment & Plan Note (Signed)
Continue Ultram as needed, get a UDS

## 2014-04-24 NOTE — Assessment & Plan Note (Signed)
Because Lexapro was making a sleepy, he went to Prozac,taking 20 mg, does not see any difference, still very "ill". At this point, I think he needs to see a specialist, I have concern about OCD and ADD and the patient as well as his wife share my concerns. Plan: Taper off Prozac, continue clonazepam and Ambien, refer to psychiatry. Get a contract and a UDS

## 2014-04-24 NOTE — Progress Notes (Signed)
Subjective:    Patient ID: Anthony Woodard, male    DOB: 1965/05/10, 49 y.o.   MRN: [id number]  DOS:  04/24/2014 Type of visit - description :  Follow-up, here with his wife Interval history: Anxiety, on Prozac 20 mg and clonazepam, doesn't feel like they're doing anything, he remains "ill, devil"  Insomnia, takes clonazepam 1 at nighttime and 1/2 ambien, sleeps  4 hours which according to pt is goodceptable. Does not like to take more [place] because he read the warnings from the pharmacy Aches and pains, on Ultram as needed, doing relatively okay. We also talk about possibly OCD and ADD.  Review of Systems   Past Medical History  Diagnosis Date  . Kidney stones     Lt side.  . Arthralgia     All over.  . Allergy   . Anxiety and depression 10/12/2013  . Hypertriglyceridemia   . Hyperlipidemia   . PUD (peptic ulcer disease)     Past Surgical History  Procedure Laterality Date  . Shoulder surgery Right Biceps tendon tear. And shoulder repair.    History   Social History  . Marital Status: Married    Spouse Name: N/A  . Number of Children: 4  . Years of Education: N/A   Occupational History  . machinery , driver     Social History Main Topics  . Smoking status: Heavy Tobacco Smoker -- 2.00 packs/day for 27 years    Types: Cigarettes  . Smokeless tobacco: Never Used     Comment: started at age 49, 2 ppd   . Alcohol Use: 0.6 oz/week    1 Cans of beer per week     Comment: rare   . Drug Use: No  . Sexual Activity: Yes   Other Topics Concern  . Not on file   Social History Narrative        Medication List       This list is accurate as of: 04/24/14 11:59 PM.  Always use your most recent med list.               albuterol 108 (90 BASE) MCG/ACT inhaler  Commonly known as:  PROVENTIL HFA;VENTOLIN HFA  Inhale 2 puffs into the lungs every 6 (six) hours as needed for wheezing or shortness of breath.     aspirin EC 81 MG tablet  Take 81 mg by mouth daily.     clonazePAM 0.5 MG tablet  Commonly known as:  KLONOPIN  Take 1 tablet (0.5 mg total) by mouth 2 (two) times daily as needed.     cyclobenzaprine 10 MG tablet  Commonly known as:  FLEXERIL  Take 1 tablet (10 mg total) by mouth at bedtime as needed for muscle spasms.     Fenofibrate 40 MG Tabs  Take 2 tablets (80 mg total) by mouth daily.     pantoprazole 40 MG tablet  Commonly known as:  PROTONIX  Take 1 tablet (40 mg total) by mouth daily.     ranitidine 300 MG tablet  Commonly known as:  ZANTAC  Take 1 tablet (300 mg total) by mouth at bedtime.     traMADol 50 MG tablet  Commonly known as:  ULTRAM  Take 1 tablet (50 mg total) by mouth every 8 (eight) hours as needed.     zolpidem 10 MG tablet  Commonly known as:  AMBIEN  Take 1 tablet (10 mg total) by mouth at bedtime as needed for sleep.  Objective:   Physical Exam BP 128/68 mmHg  Pulse 103  Temp(Src) 98 F (36.7 C) (Oral)  Ht 6' (1.829 m)  Wt 219 lb 4 oz (99.451 kg)  BMI 29.73 kg/m2  SpO2 98%  General:   Well developed, well nourished . NAD.  HEENT:  Normocephalic . Face symmetric, atraumatic  Neurologic:  alert & oriented X3.  Speech normal, gait appropriate for age and unassisted Psych--  Cognition and judgment appear intact.  Cooperative with normal attention span and concentration.  Behavior appropriate. He actually looks more calm today, less talkative.      Assessment & Plan:

## 2014-04-24 NOTE — Patient Instructions (Signed)
Continue clonazepam twice a day as needed for anxiety Continue  Ambien as needed for difficulty sleeping  Prozac: take 1 tablet daily for 10 days, then half tablet daily for 10 days,  then stop  Will refer you to a psychiatrist   please come back for a checkup 3 months from today, fasting.

## 2014-05-01 ENCOUNTER — Other Ambulatory Visit: Payer: Self-pay | Admitting: Internal Medicine

## 2014-05-02 ENCOUNTER — Telehealth: Payer: Self-pay

## 2014-05-02 ENCOUNTER — Other Ambulatory Visit: Payer: Self-pay

## 2014-05-02 MED ORDER — TRAMADOL HCL 50 MG PO TABS: 50.0000 mg | ORAL_TABLET | Freq: Three times a day (TID) | ORAL | Status: DC | PRN

## 2014-05-02 MED ORDER — ZOLPIDEM TARTRATE 10 MG PO TABS: 10.0000 mg | ORAL_TABLET | Freq: Every evening | ORAL | Status: DC | PRN

## 2014-05-02 MED ORDER — CLONAZEPAM 0.5 MG PO TABS: 0.5000 mg | ORAL_TABLET | Freq: Two times a day (BID) | ORAL | Status: DC | PRN

## 2014-05-02 NOTE — Telephone Encounter (Signed)
Okay to do same amounts, no refills

## 2014-05-02 NOTE — Telephone Encounter (Signed)
UDS: 04/24/2014  Negative for Clonazepam Positive for Tramadol Negative for Ambien Positive for Cyclobenzaprine Positive for Fluoxetine  Low risk per Dr. Drue NovelPaz 05/02/2014

## 2014-05-02 NOTE — Telephone Encounter (Signed)
Rx's printed, awaiting signature by Dr. Paz.  

## 2014-05-02 NOTE — Telephone Encounter (Signed)
Pt informed via MyChart that [facility]

## 2014-05-02 NOTE — Telephone Encounter (Signed)
Pt is requesting refill on Ambien, Tramadol, and Clonazepam.  Last OV: 04/24/2014 Last Fill on Ambien: 03/28/2014 # 30 0RF Last Fill on Tramadol: 03/07/2014 # 60 0RF Last Fill on Clonazepam: 04/03/2014 # 40 0RF  UDS: Pending??  Please advise.

## 2014-05-22 ENCOUNTER — Encounter: Payer: Self-pay | Admitting: Internal Medicine

## 2014-05-23 MED ORDER — CLONAZEPAM 0.5 MG PO TABS: 0.5000 mg | ORAL_TABLET | Freq: Two times a day (BID) | ORAL | Status: DC | PRN

## 2014-05-23 NOTE — Telephone Encounter (Signed)
-----   Message from Wanda PlumpJose E Paz, MD sent at 05/22/2014  5:36 PM EDT ----- Regarding: Clonazepam Please see patient's email, print and  fax a prescription for clonazepam # 60 and one refill

## 2014-05-23 NOTE — Telephone Encounter (Signed)
Rx printed, awaiting MD signature.  

## 2014-05-23 NOTE — Telephone Encounter (Signed)
Rx faxed to [facility].  

## 2014-06-02 ENCOUNTER — Encounter: Payer: Self-pay | Admitting: Internal Medicine

## 2014-06-02 ENCOUNTER — Telehealth: Payer: Self-pay

## 2014-06-02 MED ORDER — CYCLOBENZAPRINE HCL 10 MG PO TABS: 10.0000 mg | ORAL_TABLET | Freq: Every evening | ORAL | Status: DC | PRN

## 2014-06-02 NOTE — Telephone Encounter (Signed)
Rx sent to [facility]

## 2014-06-02 NOTE — Telephone Encounter (Signed)
Pt is requesting refill on Cyclobenazeprine.   Last OV: 04/24/2014 Last Fill: 03/07/2014 # 30 and 2 RF UDS: 04/24/2014 Low risk  Please advise.

## 2014-06-02 NOTE — Telephone Encounter (Signed)
Ok 30 and 5 RF 

## 2014-06-22 ENCOUNTER — Encounter: Payer: Self-pay | Admitting: Internal Medicine

## 2014-06-22 ENCOUNTER — Other Ambulatory Visit: Payer: Self-pay | Admitting: Internal Medicine

## 2014-06-23 ENCOUNTER — Telehealth: Payer: Self-pay

## 2014-06-23 MED ORDER — TRAMADOL HCL 50 MG PO TABS: 50.0000 mg | ORAL_TABLET | Freq: Three times a day (TID) | ORAL | Status: DC | PRN

## 2014-06-23 MED ORDER — ZOLPIDEM TARTRATE 10 MG PO TABS: 10.0000 mg | ORAL_TABLET | Freq: Every evening | ORAL | Status: DC | PRN

## 2014-06-23 NOTE — Telephone Encounter (Signed)
Received fax confirmation on 06/23/2014 at 1136.

## 2014-06-23 NOTE — Telephone Encounter (Signed)
Ok 60 and 30

## 2014-06-23 NOTE — Telephone Encounter (Signed)
Received MyChart message from Pt requesting refill on Tramadol and Ambien.  Last OV: 04/24/2014 Last Fill on Tramadol: 05/02/2014 #60 0RF Last Fill on Ambien: 05/02/2014 #30 0RF UDS: 04/24/2014 Low risk  Please advise.

## 2014-06-23 NOTE — Telephone Encounter (Signed)
Rx printed, awaiting MD signature.  

## 2014-06-23 NOTE — Telephone Encounter (Signed)
Rx's faxed to [facility].

## 2014-06-26 ENCOUNTER — Telehealth: Payer: Self-pay

## 2014-06-26 MED ORDER — FENOFIBRATE 48 MG PO TABS: 96.0000 mg | ORAL_TABLET | Freq: Every day | ORAL | Status: DC

## 2014-06-26 NOTE — Telephone Encounter (Signed)
That is ok, 2 by mouth daily

## 2014-06-26 NOTE — Telephone Encounter (Signed)
Fenofibrate 40 mg d/c and changed to Fenofibrate 48 mg: Take 2 tablets daily [facility]

## 2014-06-26 NOTE — Telephone Encounter (Signed)
[facility] Pt's Fenofibrate 40 mg: Take 2 tablets daily. Fenofibrate 40 mg is currently unavailable from their manufacturer. Requesting medication be changed to Fenofibrate 48 mg. Please advise.

## 2014-07-10 ENCOUNTER — Other Ambulatory Visit: Payer: Self-pay

## 2014-07-26 ENCOUNTER — Encounter: Payer: Self-pay | Admitting: Internal Medicine

## 2014-07-26 ENCOUNTER — Other Ambulatory Visit: Payer: Self-pay | Admitting: Internal Medicine

## 2014-07-26 ENCOUNTER — Telehealth: Payer: Self-pay

## 2014-07-26 ENCOUNTER — Other Ambulatory Visit: Payer: Self-pay

## 2014-07-26 MED ORDER — PANTOPRAZOLE SODIUM 40 MG PO TBEC: 40.0000 mg | DELAYED_RELEASE_TABLET | Freq: Every day | ORAL | Status: DC

## 2014-07-26 MED ORDER — ZOLPIDEM TARTRATE 10 MG PO TABS: 10.0000 mg | ORAL_TABLET | Freq: Every evening | ORAL | Status: AC | PRN

## 2014-07-26 MED ORDER — CLONAZEPAM 0.5 MG PO TABS: 0.5000 mg | ORAL_TABLET | Freq: Two times a day (BID) | ORAL | Status: DC | PRN

## 2014-07-26 MED ORDER — TRAMADOL HCL 50 MG PO TABS
50.0000 mg | ORAL_TABLET | Freq: Three times a day (TID) | ORAL | Status: DC | PRN
Start: 2014-07-26 — End: 2014-08-25

## 2014-07-26 NOTE — Telephone Encounter (Signed)
Received MyChart message from Pt, requesting refills on Klonopin, Ambien, and Tramadol.  Last OV: 04/24/2014, next appt scheduled for 08/04/2014  Last Fill on Klonopin: 05/23/2014 #60 1RF Last Fill on Ambien: 06/23/2014 #30 0RF Last Fill on Tramadol: 06/23/2014 #60 0RF  UDS: 04/24/2014 Low risk  Please advise.

## 2014-07-26 NOTE — Telephone Encounter (Signed)
Rx's printed, awaiting MD signature.  

## 2014-07-26 NOTE — Telephone Encounter (Signed)
Rx's faxed to [facility].

## 2014-07-26 NOTE — Telephone Encounter (Signed)
Ok to call 1 month supply , no rf

## 2014-07-26 NOTE — Telephone Encounter (Signed)
Requests sent to Dr. Drue Novel in another phone message.

## 2014-07-26 NOTE — Telephone Encounter (Signed)
Pt informed via MyChart that Rx's have been faxed.

## 2014-07-27 ENCOUNTER — Encounter: Payer: Self-pay | Admitting: Medical

## 2014-07-27 ENCOUNTER — Ambulatory Visit (INDEPENDENT_AMBULATORY_CARE_PROVIDER_SITE_OTHER): Payer: Managed Care, Other (non HMO) | Admitting: Medical

## 2014-07-27 VITALS — BP 134/85 | HR 95 | Temp 98.7°F | Ht 72.0 in | Wt 208.8 lb

## 2014-07-27 DIAGNOSIS — R252 Cramp and spasm: Secondary | ICD-10-CM | POA: Diagnosis not present

## 2014-07-27 DIAGNOSIS — R197 Diarrhea, unspecified: Secondary | ICD-10-CM | POA: Insufficient documentation

## 2014-07-27 LAB — IRON: Iron: 107 ug/dL (ref 42–165)

## 2014-07-27 MED ORDER — ONDANSETRON 8 MG PO TBDP: 8.0000 mg | ORAL_TABLET | Freq: Three times a day (TID) | ORAL | Status: DC | PRN

## 2014-07-27 MED ORDER — DIPHENOXYLATE-ATROPINE 2.5-0.025 MG PO TABS: 1.0000 | ORAL_TABLET | Freq: Four times a day (QID) | ORAL | Status: DC | PRN

## 2014-07-27 NOTE — Patient Instructions (Addendum)
Diarrhea You have likely viral gastroenteritis. I want you to rest, hydrate, follow bland diet guidlines and take tylenol for fever.  Lomotil rx for diarrhea. For nausea of vomiting, I am prescribing zofran.   There is some chance that your have a bacterial infection so I do want you to get stool panel kit and turn that in as soon as possible. Turning stool panel kit earlier will provide [place] with quicker result of studies and more informed decision if antibiotics are needed.  Please get cbc and cmp today. With muscle cramps will check mg level.     Rest this weekend. Stay in doors.  Follow up in 7 days or as needed.  Pt mentioned in past when he get labs sometimes feel like may pass out. So I advised him hydrate well today and tomorrow. When he turns stools panel in tomorrow can get the labs done.

## 2014-07-27 NOTE — Progress Notes (Signed)
Pre visit review using our clinic review tool, if applicable. No additional management support is needed unless otherwise documented below in the visit note. 

## 2014-07-27 NOTE — Assessment & Plan Note (Signed)
You have likely viral gastroenteritis. I want you to rest, hydrate, follow bland diet guidlines and take tylenol for fever.  Lomotil rx for diarrhea. For nausea of vomiting, I am prescribing zofran.   There is some chance that your have a bacterial infection so I do want you to get stool panel kit and turn that in as soon as possible. Turning stool panel kit earlier will provide [place] with quicker result of studies and more informed decision if antibiotics are needed.  Please get cbc and cmp today. With muscle cramps will check mg level.

## 2014-07-27 NOTE — Progress Notes (Signed)
Subjective:    Patient ID: Anthony Woodard, male    DOB: 01-01-1966, 49 y.o.   MRN: [id number]  HPI   Pt had ha on Monday and this is resolved. Tuesday he started throwing up 5-6 times. Tuesday and wed diarrhea. Still some nausea. Pt able to eat but not much. He drinking some gatorade. Some cramping of his legs in past but worse last couple of days.  Pt in today reporting  diarrhea for  2 days. On review no  association with with any foods or meal that immediately preceded onset of diarrhea. Report contact with contact with no  persons with GI illness. Recent no antibiotics. Reports no history of an inflammatory bowel diseases. Reports approximately number of  5 loose stools a day. Stomach rumbles some. Pt has tried otc treatments.     Review of Systems  Constitutional: Negative for fever, chills and fatigue.  Respiratory: Negative for cough, choking and wheezing.   Cardiovascular: Negative for chest pain and palpitations.  Gastrointestinal: Positive for diarrhea. Negative for nausea, vomiting, abdominal pain, constipation, blood in stool, abdominal distention, anal bleeding and rectal pain.       Nausea still but no more vomiting.  Genitourinary: Negative.   Musculoskeletal: Negative for back pain.  Skin: Negative for rash.  Neurological: Negative for dizziness, syncope, speech difficulty, weakness, light-headedness and numbness.  Hematological: Negative for adenopathy.  Psychiatric/Behavioral: Negative for confusion and sleep disturbance.   Past Medical History  Diagnosis Date  . Kidney stones     Lt side.  . Arthralgia     All over.  . Allergy   . Anxiety and depression 10/12/2013  . Hypertriglyceridemia   . Hyperlipidemia   . PUD (peptic ulcer disease)     History   Social History  . Marital Status: Married    Spouse Name: N/A  . Number of Children: 4  . Years of Education: N/A   Occupational History  . machinery , driver     Social History Main Topics  .  Smoking status: Heavy Tobacco Smoker -- 2.00 packs/day for 27 years    Types: Cigarettes  . Smokeless tobacco: Never Used     Comment: started at age 28, 2 ppd   . Alcohol Use: 0.6 oz/week    1 Cans of beer per week     Comment: rare   . Drug Use: No  . Sexual Activity: Yes   Other Topics Concern  . Not on file   Social History Narrative    Past Surgical History  Procedure Laterality Date  . Shoulder surgery Right Biceps tendon tear. And shoulder repair.    Family History  Problem Relation Age of Onset  . Stroke Father   . CAD Sister     stent at age 64s  . Diabetes Brother   . Colon cancer Neg Hx   . Prostate cancer Neg Hx   . COPD Mother   . Ovarian cancer Sister   . CAD Sister     PCI at age 54    Allergies  Allergen Reactions  . Hydrocodone Other (See Comments)    Alters mental status . Pt states gets mood swings.   Shon Hale [Beclomethasone]     Sweats , almost pass out    Current Outpatient Prescriptions on File Prior to Visit  Medication Sig Dispense Refill  . albuterol (PROVENTIL HFA;VENTOLIN HFA) 108 (90 BASE) MCG/ACT inhaler Inhale 2 puffs into the lungs every 6 (six) hours as needed  for wheezing or shortness of breath. 1 Inhaler 1  . aspirin EC 81 MG tablet Take 81 mg by mouth daily.    . clonazePAM (KLONOPIN) 0.5 MG tablet Take 1 tablet (0.5 mg total) by mouth 2 (two) times daily as needed. 60 tablet 0  . cyclobenzaprine (FLEXERIL) 10 MG tablet Take 1 tablet (10 mg total) by mouth at bedtime as needed for muscle spasms. 30 tablet 5  . fenofibrate (TRICOR) 48 MG tablet Take 2 tablets (96 mg total) by mouth daily. 60 tablet 5  . pantoprazole (PROTONIX) 40 MG tablet Take 1 tablet (40 mg total) by mouth daily. 30 tablet 5  . ranitidine (ZANTAC) 300 MG tablet Take 1 tablet (300 mg total) by mouth at bedtime. (Patient taking differently: Take 150 mg by mouth at bedtime. ) 30 tablet 12  . traMADol (ULTRAM) 50 MG tablet Take 1 tablet (50 mg total) by mouth every 8  (eight) hours as needed. 60 tablet 0  . zolpidem (AMBIEN) 10 MG tablet Take 1 tablet (10 mg total) by mouth at bedtime as needed for sleep. 30 tablet 0   No current facility-administered medications on file prior to visit.    BP 134/85 mmHg  Pulse 95  Temp(Src) 98.7 F (37.1 C) (Oral)  Ht 6' (1.829 m)  Wt 208 lb 12.8 oz (94.711 kg)  BMI 28.31 kg/m2  SpO2 98%       Objective:   Physical Exam  General Appearance- Not in acute distress.  HEENT Eyes- Scleraeral/Conjuntiva-bilat- Not Yellow. Mouth & Throat- Normal. Normal moist buccal mucosa  Chest and Lung Exam Auscultation: Breath sounds:-Normal. Adventitious sounds:- No Adventitious sounds.  Cardiovascular Auscultation:Rythm - Regular. Heart Sounds -Normal heart sounds.  Abdomen Inspection:-Inspection Normal.  Palpation/Perucssion: Palpation and Percussion of the abdomen reveal- Non Tender, No Rebound tenderness, No rigidity(Guarding) and No Palpable abdominal masses.  Liver:-Normal.  Spleen:- Normal.   Back- no cva tenderness  Skin- no tenting.      Assessment & Plan:

## 2014-07-28 ENCOUNTER — Telehealth: Payer: Self-pay | Admitting: Internal Medicine

## 2014-07-28 ENCOUNTER — Other Ambulatory Visit: Payer: Managed Care, Other (non HMO)

## 2014-07-28 NOTE — Telephone Encounter (Signed)
Pt declined getting labs despite my advisement.States he feels better.

## 2014-07-28 NOTE — Telephone Encounter (Signed)
Caller name: kristie Relation to pt: wife Call back number: [phone] Pharmacy:  Reason for call:   States that patient is feeling better and does not want to come in for labs or to drop off stool sample. Saw Ramon Dredge

## 2014-08-01 ENCOUNTER — Telehealth: Payer: Self-pay | Admitting: Medical

## 2014-08-01 NOTE — Telephone Encounter (Signed)
Pt decided not to do lab test I ordered. I am clearing not completed labs.

## 2014-08-04 ENCOUNTER — Encounter: Payer: Self-pay | Admitting: Internal Medicine

## 2014-08-04 ENCOUNTER — Ambulatory Visit (INDEPENDENT_AMBULATORY_CARE_PROVIDER_SITE_OTHER): Payer: Managed Care, Other (non HMO) | Admitting: Internal Medicine

## 2014-08-04 VITALS — BP 122/84 | HR 83 | Temp 97.8°F | Ht 72.0 in | Wt 211.1 lb

## 2014-08-04 DIAGNOSIS — F419 Anxiety disorder, unspecified: Secondary | ICD-10-CM

## 2014-08-04 DIAGNOSIS — E785 Hyperlipidemia, unspecified: Secondary | ICD-10-CM

## 2014-08-04 DIAGNOSIS — R0609 Other forms of dyspnea: Secondary | ICD-10-CM

## 2014-08-04 DIAGNOSIS — F418 Other specified anxiety disorders: Secondary | ICD-10-CM

## 2014-08-04 DIAGNOSIS — M2550 Pain in unspecified joint: Secondary | ICD-10-CM

## 2014-08-04 DIAGNOSIS — F329 Major depressive disorder, single episode, unspecified: Secondary | ICD-10-CM

## 2014-08-04 DIAGNOSIS — K219 Gastro-esophageal reflux disease without esophagitis: Secondary | ICD-10-CM

## 2014-08-04 DIAGNOSIS — G8929 Other chronic pain: Secondary | ICD-10-CM

## 2014-08-04 LAB — LDL CHOLESTEROL, DIRECT: Direct LDL: 74 mg/dL

## 2014-08-04 LAB — LIPID PANEL
Cholesterol: 159 mg/dL (ref 0–200)
HDL: 24.2 mg/dL — AB (ref >39.00)
NONHDL: 134.8
Total CHOL/HDL Ratio: 7
Triglycerides: 366 mg/dL — ABNORMAL HIGH (ref 0.0–149.0)
VLDL: 73.2 mg/dL — ABNORMAL HIGH (ref 0.0–40.0)

## 2014-08-04 LAB — BASIC METABOLIC PANEL
BUN: 14 mg/dL (ref 6–23)
CHLORIDE: 101 meq/L (ref 96–112)
CO2: 26 meq/L (ref 19–32)
Calcium: 9.5 mg/dL (ref 8.4–10.5)
Creatinine, Ser: 1.1 mg/dL (ref 0.40–1.50)
GFR: 76.11 mL/min (ref >60.00)
Glucose, Bld: 128 mg/dL — ABNORMAL HIGH (ref 70–99)
Potassium: 4 mEq/L (ref 3.5–5.1)
Sodium: 138 mEq/L (ref 135–145)

## 2014-08-04 LAB — ALT: ALT: 63 U/L — ABNORMAL HIGH (ref 0–53)

## 2014-08-04 LAB — AST: AST: 35 U/L (ref 0–37)

## 2014-08-04 NOTE — Assessment & Plan Note (Addendum)
Also the pain is now in the right shoulder on and off, generalized aches decrease. On ultram  usually 1 a day. UDS 04-2014 low. Plan: Tramadol as needed

## 2014-08-04 NOTE — Assessment & Plan Note (Signed)
Currently on clonazepam and Ambien, he feels well, decided not to see psychiatry. UDS 04-2014 low risk. Please see previous entries , I am somewhat concerned about OCD, ADD however patient is clinically doing okay. Plan: Continue same medications, refill as needed

## 2014-08-04 NOTE — Assessment & Plan Note (Signed)
High triglycerides, on fenofibrate's, check labs. If she is still elevated consider niacin which he is not  taking

## 2014-08-04 NOTE — Progress Notes (Signed)
Subjective:    Patient ID: Anthony Woodard, male    DOB: Dec 31, 1965, 49 y.o.   MRN: [id number]  DOS:  08/04/2014 Type of visit - description : Routine office visit, here by himself Interval history: Anxiety depression, good compliance of medication, did not see psychiatry. Dyslipidemia, good compliance with fenofibrate Was recently seen with acute diarrhea, symptoms resolve, chronic diarrhea and GERD symptoms much improved lately. Pain control, on one tramadol mostly at night, pain is still concentrated mostly on the right shoulder.   Review of Systems  Shortness of breath significantly decreased, states that he thinks is because he has decrease tobacco use. No abdominal pain or blood in the stools. No dysphagia or odynophagia.  Past Medical History  Diagnosis Date  . Kidney stones     Lt side.  . Arthralgia     All over.  . Allergy   . Anxiety and depression 10/12/2013  . Hypertriglyceridemia   . Hyperlipidemia   . PUD (peptic ulcer disease)     Past Surgical History  Procedure Laterality Date  . Shoulder surgery Right Biceps tendon tear. And shoulder repair.    History   Social History  . Marital Status: Married    Spouse Name: N/A  . Number of Children: 4  . Years of Education: N/A   Occupational History  . machinery , driver     Social History Main Topics  . Smoking status: Heavy Tobacco Smoker -- 2.00 packs/day for 27 years    Types: Cigarettes  . Smokeless tobacco: Never Used     Comment: started at age 49, 2 ppd   . Alcohol Use: 0.6 oz/week    1 Cans of beer per week     Comment: rare   . Drug Use: No  . Sexual Activity: Yes   Other Topics Concern  . Not on file   Social History Narrative        Medication List       This list is accurate as of: 08/04/14 11:59 PM.  Always use your most recent med list.               albuterol 108 (90 BASE) MCG/ACT inhaler  Commonly known as:  PROVENTIL HFA;VENTOLIN HFA  Inhale 2 puffs into the lungs  every 6 (six) hours as needed for wheezing or shortness of breath.     aspirin EC 81 MG tablet  Take 81 mg by mouth daily.     clonazePAM 0.5 MG tablet  Commonly known as:  KLONOPIN  Take 1 tablet (0.5 mg total) by mouth 2 (two) times daily as needed.     cyclobenzaprine 10 MG tablet  Commonly known as:  FLEXERIL  Take 1 tablet (10 mg total) by mouth at bedtime as needed for muscle spasms.     fenofibrate 48 MG tablet  Commonly known as:  TRICOR  Take 2 tablets (96 mg total) by mouth daily.     pantoprazole 40 MG tablet  Commonly known as:  PROTONIX  Take 1 tablet (40 mg total) by mouth daily.     ranitidine 300 MG tablet  Commonly known as:  ZANTAC  Take 1 tablet (300 mg total) by mouth at bedtime.     traMADol 50 MG tablet  Commonly known as:  ULTRAM  Take 1 tablet (50 mg total) by mouth every 8 (eight) hours as needed.     zolpidem 10 MG tablet  Commonly known as:  AMBIEN  Take 1 tablet (  10 mg total) by mouth at bedtime as needed for sleep.           Objective:   Physical Exam BP 122/84 mmHg  Pulse 83  Temp(Src) 97.8 F (36.6 C) (Oral)  Ht 6' (1.829 m)  Wt 211 lb 2 oz (95.766 kg)  BMI 28.63 kg/m2  SpO2 98%  General:   Well developed, well nourished . NAD.  HEENT:  Normocephalic . Face symmetric, atraumatic Lungs:  CTA B Normal respiratory effort, no intercostal retractions, no accessory muscle use. Heart: RRR,  no murmur.  No pretibial edema bilaterally  Skin: Not pale. Not jaundice Neurologic:  alert & oriented X3.  Speech normal, gait appropriate for age and unassisted Psych--  Cognition and judgment appear intact.  Cooperative with normal attention span and concentration.  Behavior appropriate. No anxious or depressed appearing.       Assessment & Plan:

## 2014-08-04 NOTE — Assessment & Plan Note (Signed)
According to the patient is feeling better now.

## 2014-08-04 NOTE — Assessment & Plan Note (Signed)
Currently on Protonix and Zantac, symptoms are better, decided not to see GI. Plan: Continue with same medicines

## 2014-08-04 NOTE — Patient Instructions (Signed)
Get your blood work before you leave   Continue with the same medications

## 2014-08-04 NOTE — Progress Notes (Signed)
Pre visit review using our clinic review tool, if applicable. No additional management support is needed unless otherwise documented below in the visit note. 

## 2014-08-24 ENCOUNTER — Encounter: Payer: Self-pay | Admitting: Internal Medicine

## 2014-08-25 MED ORDER — CLONAZEPAM 0.5 MG PO TABS: 0.5000 mg | ORAL_TABLET | Freq: Two times a day (BID) | ORAL | Status: DC | PRN

## 2014-08-25 MED ORDER — TRAMADOL HCL 50 MG PO TABS: 50.0000 mg | ORAL_TABLET | Freq: Three times a day (TID) | ORAL | Status: DC | PRN

## 2014-08-25 NOTE — Telephone Encounter (Signed)
-----   Message from Wanda Plump, MD sent at 08/25/2014 11:56 AM EDT ----- Regarding: Refills Okay clonazepam 60 and 2 refills Okay, tramadol #60, 2 prescriptions

## 2014-08-25 NOTE — Telephone Encounter (Signed)
Rx's printed, awaiting MD signature.  

## 2014-08-25 NOTE — Telephone Encounter (Signed)
Pt is requesting refill on Tramadol and Clonazepam.  Last OV: 08/04/2014 Last Fill on Clonazepam: 07/26/2014 #60 0RF Last Fill on Tramadol: 07/26/2014 #60 0RF UDS: 04/24/2014 Low risk  Please advise.

## 2014-08-25 NOTE — Telephone Encounter (Signed)
Rx's faxed to [facility]. Pt informed via MyChart.

## 2014-10-02 ENCOUNTER — Encounter: Payer: Self-pay | Admitting: Internal Medicine

## 2014-10-03 ENCOUNTER — Other Ambulatory Visit: Payer: Self-pay

## 2014-10-04 ENCOUNTER — Other Ambulatory Visit: Payer: Self-pay | Admitting: Internal Medicine

## 2014-10-04 ENCOUNTER — Telehealth: Payer: Self-pay | Admitting: Internal Medicine

## 2014-10-04 MED ORDER — ALPRAZOLAM 0.5 MG PO TABS: 0.5000 mg | ORAL_TABLET | Freq: Three times a day (TID) | ORAL | Status: DC | PRN

## 2014-10-04 NOTE — Telephone Encounter (Signed)
Klonopin changed to Xanax this morning by Dr. Drue Novel. See MyChart messages from 10/03/2014 for further details. Xanax faxed to [facility] pharmacy this morning.

## 2014-10-04 NOTE — Telephone Encounter (Signed)
Pt states that he is feeling very agitated and doesn't know if it is from Klonopin. He is requesting nurse to call him about meds. He is wanting to get off of it. He is wanting to know if there is something he can take just when he needs it. Best phone # [phone]. Ok to leave detailed msg.

## 2014-10-04 NOTE — Telephone Encounter (Signed)
Rx for Xanax, printed and faxed to [facility].

## 2014-10-30 ENCOUNTER — Other Ambulatory Visit: Payer: Self-pay | Admitting: Internal Medicine

## 2014-10-30 ENCOUNTER — Encounter: Payer: Self-pay | Admitting: Internal Medicine

## 2014-10-30 MED ORDER — ALPRAZOLAM 0.5 MG PO TABS: 0.5000 mg | ORAL_TABLET | Freq: Three times a day (TID) | ORAL | Status: DC | PRN

## 2014-10-30 NOTE — Telephone Encounter (Signed)
Rx faxed to [facility].  

## 2014-10-30 NOTE — Telephone Encounter (Signed)
Pt is requesting refill on Alprazolam.  Last OV: 08/04/2014, CPE scheduled for 12/22/2014 at 1000 Last Fill: 10/04/2014 #40 0RF UDS: 04/24/2014 Low risk  Please advise.

## 2014-12-03 ENCOUNTER — Encounter: Payer: Self-pay | Admitting: Internal Medicine

## 2014-12-04 MED ORDER — TRAMADOL HCL 50 MG PO TABS: 50.0000 mg | ORAL_TABLET | Freq: Three times a day (TID) | ORAL | Status: DC | PRN

## 2014-12-04 MED ORDER — CYCLOBENZAPRINE HCL 10 MG PO TABS: 10.0000 mg | ORAL_TABLET | Freq: Every evening | ORAL | Status: DC | PRN

## 2014-12-04 NOTE — Telephone Encounter (Signed)
Tramadol and Flexeril printed, awaiting MD signature.

## 2014-12-04 NOTE — Telephone Encounter (Signed)
Tramadol  #60 no refills, Flexeril 30 and 5 refills

## 2014-12-04 NOTE — Telephone Encounter (Signed)
Rx faxed to [facility].  

## 2014-12-04 NOTE — Telephone Encounter (Signed)
Pt is requesting refill on Flexeril and Tramadol.  Last OV: 08/04/2014, CPE scheduled 12/22/2014 at 1000 Last Fill on Tramadol: 08/25/2014 #60 and 2 RF Last Fill on Flexeril: 06/02/2014 #30 and 5 RF UDS: 04/24/2014 Low risk  Pt needs contract at next OV.  Please advise.

## 2014-12-14 ENCOUNTER — Encounter: Payer: Self-pay | Admitting: Internal Medicine

## 2014-12-14 MED ORDER — ALPRAZOLAM 0.5 MG PO TABS: 0.5000 mg | ORAL_TABLET | Freq: Three times a day (TID) | ORAL | Status: DC | PRN

## 2014-12-14 NOTE — Telephone Encounter (Signed)
[facility]. Pt informed via MyChart.

## 2014-12-14 NOTE — Telephone Encounter (Signed)
Pt is requesting refill on Alprazolam.  Last OV: 08/04/2014, CPE scheduled on 12/22/2014 at 10:00 Last Fill: 10/30/2014 #90 and 0RF Pt can take 1 tablet po tid PRN UDS: 04/24/2014 Low risk  Please advise.

## 2014-12-14 NOTE — Telephone Encounter (Signed)
#  90 and 1 refill approved by Dr. Drue NovelPaz. Rx printed, awaiting MD signature.

## 2014-12-22 ENCOUNTER — Ambulatory Visit (INDEPENDENT_AMBULATORY_CARE_PROVIDER_SITE_OTHER): Payer: Managed Care, Other (non HMO) | Admitting: Internal Medicine

## 2014-12-22 ENCOUNTER — Encounter: Payer: Self-pay | Admitting: Internal Medicine

## 2014-12-22 VITALS — BP 124/72 | HR 86 | Temp 98.1°F | Ht 72.0 in | Wt 212.4 lb

## 2014-12-22 DIAGNOSIS — R945 Abnormal results of liver function studies: Secondary | ICD-10-CM

## 2014-12-22 DIAGNOSIS — Z09 Encounter for follow-up examination after completed treatment for conditions other than malignant neoplasm: Secondary | ICD-10-CM

## 2014-12-22 DIAGNOSIS — Z0000 Encounter for general adult medical examination without abnormal findings: Secondary | ICD-10-CM | POA: Diagnosis not present

## 2014-12-22 DIAGNOSIS — Z1159 Encounter for screening for other viral diseases: Secondary | ICD-10-CM

## 2014-12-22 DIAGNOSIS — R7989 Other specified abnormal findings of blood chemistry: Secondary | ICD-10-CM

## 2014-12-22 LAB — LIPID PANEL
CHOL/HDL RATIO: 9
Cholesterol: 300 mg/dL — ABNORMAL HIGH (ref 0–200)
HDL: 32.4 mg/dL — AB (ref >39.00)

## 2014-12-22 LAB — CBC WITH DIFFERENTIAL/PLATELET
BASOS ABS: 0 10*3/uL (ref 0.0–0.1)
Basophils Relative: 0.4 % (ref 0.0–3.0)
EOS ABS: 0.2 10*3/uL (ref 0.0–0.7)
Eosinophils Relative: 2.1 % (ref 0.0–5.0)
HCT: 51.3 % (ref 39.0–52.0)
Hemoglobin: 16.9 g/dL (ref 13.0–17.0)
LYMPHS ABS: 3.1 10*3/uL (ref 0.7–4.0)
Lymphocytes Relative: 30.6 % (ref 12.0–46.0)
MCHC: 33 g/dL (ref 30.0–36.0)
MCV: 86.9 fl (ref 78.0–100.0)
MONO ABS: 0.7 10*3/uL (ref 0.1–1.0)
MONOS PCT: 7.3 % (ref 3.0–12.0)
NEUTROS PCT: 59.6 % (ref 43.0–77.0)
Neutro Abs: 6 10*3/uL (ref 1.4–7.7)
Platelets: 196 10*3/uL (ref 150.0–400.0)
RBC: 5.9 Mil/uL — AB (ref 4.22–5.81)
RDW: 13.9 % (ref 11.5–15.5)
WBC: 10 10*3/uL (ref 4.0–10.5)

## 2014-12-22 LAB — BASIC METABOLIC PANEL
BUN: 17 mg/dL (ref 6–23)
CHLORIDE: 102 meq/L (ref 96–112)
CO2: 31 meq/L (ref 19–32)
CREATININE: 1.19 mg/dL (ref 0.40–1.50)
Calcium: 10.5 mg/dL (ref 8.4–10.5)
GFR: 69.39 mL/min (ref >60.00)
Glucose, Bld: 115 mg/dL — ABNORMAL HIGH (ref 70–99)
Potassium: 4.8 mEq/L (ref 3.5–5.1)
Sodium: 142 mEq/L (ref 135–145)

## 2014-12-22 LAB — HEMOGLOBIN A1C: HEMOGLOBIN A1C: 7.2 % — AB (ref 4.6–6.5)

## 2014-12-22 LAB — HEPATITIS C ANTIBODY: HCV AB: NEGATIVE

## 2014-12-22 LAB — AST: AST: 28 U/L (ref 0–37)

## 2014-12-22 LAB — ALT: ALT: 41 U/L (ref 0–53)

## 2014-12-22 LAB — LDL CHOLESTEROL, DIRECT: Direct LDL: 103 mg/dL

## 2014-12-22 MED ORDER — BUPROPION HCL 100 MG PO TABS: 100.0000 mg | ORAL_TABLET | Freq: Two times a day (BID) | ORAL | Status: AC

## 2014-12-22 NOTE — Assessment & Plan Note (Signed)
Td 2011 per patient Flu shot, pneumonia shot: Benefits discussed, strongly declined. No need to screen for colon or prostate cancer at this time Tobacco abuse: See above. Diet and exercise discussed

## 2014-12-22 NOTE — Progress Notes (Signed)
Subjective:    Patient ID: Anthony Woodard, male    DOB: Jun 21, 1965, 49 y.o.   MRN: [id number]  DOS:  12/22/2014 Type of visit - description : Complete physical exam, here with his wife Interval history: Since her last office visit, no new concerns.    Review of Systems Constitutional: No fever. No chills. No unexplained wt changes. No unusual sweats  HEENT: No dental problems, no ear discharge, no facial swelling, no voice changes. No eye discharge, no eye  redness , no  intolerance to light   Respiratory: No wheezing , no  difficulty breathing. No cough , no mucus production  Cardiovascular: No CP, no leg swelling , no  Palpitations  GI: no nausea, no vomiting, no diarrhea , no  abdominal pain.  No blood in the stools. No dysphagia, no odynophagia. Left-sided hernia?    Endocrine: No polyphagia, no polyuria , no polydipsia  GU: No dysuria, gross hematuria, difficulty urinating. No urinary urgency, no frequency.  Musculoskeletal: Aches and pains at baseline  Skin: No change in the color of the skin, palor , no  Rash  Allergic, immunologic: No environmental allergies , has developed a shrimp allergy: Getting dizzy, tingling all over. No rash or itching. Neurological: No dizziness no  syncope. No headaches. No diplopia, no slurred, no slurred speech, no motor deficits, no facial  Numbness  Hematological: No enlarged lymph nodes, no easy bruising , no unusual bleedings  Psychiatry: No suicidal ideas, no hallucinations, no beavior problems, no confusion.  No unusual/severe anxiety, no depression   Past Medical History  Diagnosis Date  . Kidney stones     Lt side.  . Arthralgia     All over.  . Allergy   . Anxiety and depression 10/12/2013  . Hypertriglyceridemia   . Hyperlipidemia   . PUD (peptic ulcer disease)     Past Surgical History  Procedure Laterality Date  . Shoulder surgery Right Biceps tendon tear. And shoulder repair.    Social History   Social History   . Marital Status: Married    Spouse Name: N/A  . Number of Children: 4  . Years of Education: N/A   Occupational History  . machinery , driver     Social History Main Topics  . Smoking status: Heavy Tobacco Smoker -- 2.00 packs/day for 27 years    Types: Cigarettes  . Smokeless tobacco: Never Used     Comment: started at age 54, 2 ppd   . Alcohol Use: 0.6 oz/week    1 Cans of beer per week     Comment: rare   . Drug Use: No  . Sexual Activity: Yes   Other Topics Concern  . Not on file   Social History Narrative   Corliss Parish is 49 y/o   Family History  Problem Relation Age of Onset  . Stroke Father   . CAD Sister     stent at age 60s  . Diabetes Brother   . Colon cancer Neg Hx   . Prostate cancer Neg Hx   . COPD Mother   . Ovarian cancer Sister   . CAD Sister     PCI at age 69         Medication List       This list is accurate as of: 12/22/14 11:59 PM.  Always use your most recent med list.               albuterol 108 (90 BASE) MCG/ACT inhaler  Commonly known as:  PROVENTIL HFA;VENTOLIN HFA  Inhale 2 puffs into the lungs every 6 (six) hours as needed for wheezing or shortness of breath.     ALPRAZolam 0.5 MG tablet  Commonly known as:  XANAX  Take 1 tablet (0.5 mg total) by mouth 3 (three) times daily as needed for anxiety.     aspirin EC 81 MG tablet  Take 81 mg by mouth daily.     buPROPion 100 MG tablet  Commonly known as:  WELLBUTRIN  Take 1 tablet (100 mg total) by mouth 2 (two) times daily.     cyclobenzaprine 10 MG tablet  Commonly known as:  FLEXERIL  Take 1 tablet (10 mg total) by mouth at bedtime as needed for muscle spasms.     fenofibrate 48 MG tablet  Commonly known as:  TRICOR  Take 2 tablets (96 mg total) by mouth daily.     pantoprazole 40 MG tablet  Commonly known as:  PROTONIX  Take 1 tablet (40 mg total) by mouth daily.     ranitidine 300 MG tablet  Commonly known as:  ZANTAC  Take 1 tablet (300 mg total) by mouth at  bedtime.     traMADol 50 MG tablet  Commonly known as:  ULTRAM  Take 1 tablet (50 mg total) by mouth every 8 (eight) hours as needed.     zolpidem 10 MG tablet  Commonly known as:  AMBIEN  Take 1 tablet (10 mg total) by mouth at bedtime as needed for sleep.           Objective:   Physical Exam BP 124/72 mmHg  Pulse 86  Temp(Src) 98.1 F (36.7 C) (Oral)  Ht 6' (1.829 m)  Wt 212 lb 6 oz (96.333 kg)  BMI 28.80 kg/m2  SpO2 96% General:   Well developed, well nourished . NAD.  HEENT:  Normocephalic . Face symmetric, atraumatic Neck: No thyromegaly, normal carotid pulses Lungs:  CTA B Normal respiratory effort, no intercostal retractions, no accessory muscle use. Heart: RRR,  no murmur.  no pretibial edema bilaterally  Abdomen:  Not distended, soft, non-tender. No rebound or rigidity. No inguinal hernias. GU: normal scrotal contents Skin: Not pale. Not jaundice Neurologic:  alert & oriented X3.  Speech normal, gait appropriate for age and unassisted Psych--  Cognition and judgment appear intact.  Cooperative with normal attention span and concentration.  Behavior appropriate. No anxious or depressed appearing.    Assessment & Plan:   Assessment>  Hyperlipidemia, high TG Anxiety depression insomnia -chronic sx, first evaluation 9- 2015  --Clinically w/ question of OCD-ADD --no better w/ lexapro-prozac, ok on xanax,ambien CP-DOE -- 12-2013: Echo -stress test (-) --PFTs moderate obst , smoker  COPD Smoker  GERD Increase LFTs:  hep B-C serology, iron transferrin normal 2015 MSK: Generalized joint pains. Sed rate, CK (-), saw rheumatology ~02-2014 no clear dx Kidney stones PUD +FH CAD sisters, MI-stent (age 39s)  PLAN Dyslipidemia: Good compliance of medication, diet is regular, request to recheck his FLP COPD: Nearly no symptoms at the present time GERD: Symptoms definitely improved, likes to change Zantac to as needed only, thinks is causing some nocturia  (?) Increase LFTs: Check alpha-1 antitrypsin. Consider a liver ultrasound. Anxiety depression and insomnia: Symptoms reportedly under "excellent control" with Xanax as needed. Takes Ambien very rarely. Tobacco abuse: We had a long conversation about this issue, risks discuss: Chantix, Wellbutrin? He would like to try Wellbutrin. Prescription provided Left-sided hernia? On and off pain, when  he was a child was told he has a hernia. Exam neg today. Recommend observation for now.  RTC 4-6 months

## 2014-12-22 NOTE — Patient Instructions (Signed)
Get your blood work before you leave   When you are ready to quit tobacco: Start Wellbutrin: The first 2 weeks take only one tablet at bedtime, then take 1 tablet twice a day  Next visit  for a checkup in 4-6 months, no fasting    (30 minutes) Please schedule an appointment at the front desk

## 2014-12-22 NOTE — Progress Notes (Signed)
Pre visit review using our clinic review tool, if applicable. No additional management support is needed unless otherwise documented below in the visit note. 

## 2014-12-23 DIAGNOSIS — Z09 Encounter for follow-up examination after completed treatment for conditions other than malignant neoplasm: Secondary | ICD-10-CM | POA: Insufficient documentation

## 2014-12-23 NOTE — Assessment & Plan Note (Signed)
Dyslipidemia: Good compliance of medication, diet is regular, request to recheck his FLP COPD: Nearly no symptoms at the present time GERD: Symptoms definitely improved, likes to change Zantac to as needed only, thinks is causing some nocturia (?) Increase LFTs: Check alpha-1 antitrypsin. Consider a liver ultrasound. Anxiety depression and insomnia: Symptoms reportedly under "excellent control" with Xanax as needed. Takes Ambien very rarely. Tobacco abuse: We had a long conversation about this issue, risks discuss: Chantix, Wellbutrin? He would like to try Wellbutrin. Prescription provided Left-sided hernia? On and off pain, when he was a child was told he has a hernia. Exam neg today. Recommend observation for now.  RTC 4-6 months

## 2014-12-25 LAB — ALPHA-1-ANTITRYPSIN: A1 ANTITRYPSIN SER: 133 mg/dL (ref 83–199)

## 2014-12-26 ENCOUNTER — Encounter: Payer: Self-pay | Admitting: Internal Medicine

## 2014-12-29 ENCOUNTER — Other Ambulatory Visit: Payer: Self-pay | Admitting: Internal Medicine

## 2015-01-01 ENCOUNTER — Other Ambulatory Visit: Payer: Self-pay

## 2015-01-01 ENCOUNTER — Encounter: Payer: Self-pay | Admitting: Internal Medicine

## 2015-01-01 DIAGNOSIS — R739 Hyperglycemia, unspecified: Secondary | ICD-10-CM

## 2015-01-01 DIAGNOSIS — E785 Hyperlipidemia, unspecified: Secondary | ICD-10-CM

## 2015-01-01 MED ORDER — TRAMADOL HCL 50 MG PO TABS: 50.0000 mg | ORAL_TABLET | Freq: Three times a day (TID) | ORAL | Status: DC | PRN

## 2015-01-01 NOTE — Telephone Encounter (Signed)
Rx faxed to [facility].  

## 2015-01-01 NOTE — Telephone Encounter (Signed)
Rx printed, awaiting MD signature.  

## 2015-01-01 NOTE — Telephone Encounter (Signed)
Pt is requesting refill on Tramadol.  Last OV: 12/22/2014 Last Fill: 12/04/2014 #60 and 0RF Pt can take 1 tablet q8h PRN UDS: Pending  Please advise.

## 2015-01-02 ENCOUNTER — Encounter: Payer: Self-pay | Admitting: Internal Medicine

## 2015-01-13 ENCOUNTER — Other Ambulatory Visit: Payer: Self-pay | Admitting: Internal Medicine

## 2015-01-22 ENCOUNTER — Other Ambulatory Visit: Payer: Self-pay | Admitting: Internal Medicine

## 2015-01-22 NOTE — Telephone Encounter (Signed)
Too early, next refill 02/13/2015. Maybe he doesn't know he had a refill

## 2015-01-22 NOTE — Telephone Encounter (Signed)
Pt is requesting refill on Alprazolam.  Last OV: 12/22/2014 Last Fill: 12/14/2014 #90 and 1RF Pt sig: 1 tablet by mouth tid PRN UDS: 04/24/2014 Low risk  Please advise.

## 2015-01-28 ENCOUNTER — Other Ambulatory Visit: Payer: Self-pay | Admitting: Internal Medicine

## 2015-02-07 ENCOUNTER — Encounter: Payer: Self-pay | Admitting: Internal Medicine

## 2015-02-13 ENCOUNTER — Encounter: Payer: Self-pay | Admitting: Internal Medicine

## 2015-02-13 MED ORDER — ALPRAZOLAM 0.5 MG PO TABS: 0.5000 mg | ORAL_TABLET | Freq: Three times a day (TID) | ORAL | Status: DC | PRN

## 2015-02-13 NOTE — Telephone Encounter (Signed)
Per Dr. Drue NovelPaz okay #90 and 1 RF. Rx printed, awaiting MD signature.

## 2015-02-13 NOTE — Telephone Encounter (Signed)
Rx faxed to [facility].  

## 2015-02-13 NOTE — Telephone Encounter (Signed)
Pt is requesting refill on Alprazolam.  Last OV: 12/22/2014 Last Fill: 12/14/2014 #90 and 1 RF Pt sig: 1 tablet tid PRN UDS: 04/24/2014 Low risk  Please advise.

## 2015-04-17 ENCOUNTER — Encounter: Payer: Self-pay | Admitting: Internal Medicine

## 2015-04-17 MED ORDER — TRAMADOL HCL 50 MG PO TABS: 50.0000 mg | ORAL_TABLET | Freq: Three times a day (TID) | ORAL | Status: DC | PRN

## 2015-04-17 NOTE — Telephone Encounter (Signed)
Rx printed, awaiting MD signature.  

## 2015-04-17 NOTE — Telephone Encounter (Signed)
Rx faxed to [facility].  

## 2015-04-17 NOTE — Telephone Encounter (Signed)
See message, okay, #60 and one refill  Received: Today    Wanda PlumpJose E Paz, MD  Conrad BurlingtonKaylyn Lyell Clugston, CMA

## 2015-05-04 ENCOUNTER — Encounter: Payer: Self-pay | Admitting: Internal Medicine

## 2015-05-04 NOTE — Telephone Encounter (Signed)
Pt is requesting refill on Alprazolam.  Last OV: 12/22/2014  Last Fill: 02/13/2015 #90 and 1RF UDS: 04/24/2014 Low risk  Please advise.

## 2015-05-07 MED ORDER — ALPRAZOLAM 0.5 MG PO TABS: 0.5000 mg | ORAL_TABLET | Freq: Three times a day (TID) | ORAL | Status: DC | PRN

## 2015-05-07 NOTE — Telephone Encounter (Signed)
Rx faxed to [facility].  

## 2015-05-07 NOTE — Telephone Encounter (Signed)
ok xanax #90 no refills  Received: Today    Wanda PlumpJose E Paz, MD  Conrad BurlingtonKaylyn Skylee Baird, CMA

## 2015-05-07 NOTE — Telephone Encounter (Signed)
Rx printed, awaiting MD signature.  

## 2015-05-25 ENCOUNTER — Ambulatory Visit: Payer: Managed Care, Other (non HMO) | Admitting: Internal Medicine

## 2015-08-08 ENCOUNTER — Encounter: Payer: Self-pay | Admitting: Internal Medicine

## 2015-08-08 MED ORDER — CYCLOBENZAPRINE HCL 10 MG PO TABS
10.0000 mg | ORAL_TABLET | Freq: Every evening | ORAL | Status: DC | PRN
Start: 2015-08-08 — End: 2016-06-26

## 2015-08-08 MED ORDER — ALPRAZOLAM 0.5 MG PO TABS: 0.5000 mg | ORAL_TABLET | Freq: Three times a day (TID) | ORAL | Status: DC | PRN

## 2015-08-08 MED ORDER — TRAMADOL HCL 50 MG PO TABS: 50.0000 mg | ORAL_TABLET | Freq: Three times a day (TID) | ORAL | Status: DC | PRN

## 2015-08-08 NOTE — Telephone Encounter (Signed)
Pt requesting refill on Alprazolam, Tramadol, and Flexeril.

## 2015-08-08 NOTE — Telephone Encounter (Signed)
What medication does pt want filled?

## 2015-08-08 NOTE — Telephone Encounter (Signed)
Pt is requesting refill on Alprazolam, Tramadol, and Flexeril. Pt currently does not have insurance (Pt's wife lost her job). Dr. Drue NovelPaz has been refilling anyway w/o OV.  Last OV: 12/22/2014 Last Fill on Tramadol: 04/17/2015 #60 and 1RF Pt sig: 1 tablet q8h PRN Last Fill on Alprazolam: 05/07/2015 #90 and 1RF Pt sig: 1 tablet TID PRN Last Fill on Flexeril: 12/04/2014 #30 and 5RF UDS: 04/24/2014 Low risk  Please advise.

## 2015-08-08 NOTE — Telephone Encounter (Signed)
I have never seen pt and covering for Dr. Drue NovelPaz. So did refill her meds but gave limited number and no refills of controlled meds. Did give refill of flexeril.

## 2015-09-13 ENCOUNTER — Encounter: Payer: Self-pay | Admitting: Internal Medicine

## 2015-09-14 MED ORDER — ALPRAZOLAM 0.5 MG PO TABS: 0.5000 mg | ORAL_TABLET | Freq: Three times a day (TID) | ORAL | 0 refills | Status: DC | PRN

## 2015-09-14 MED ORDER — TRAMADOL HCL 50 MG PO TABS: 50.0000 mg | ORAL_TABLET | Freq: Three times a day (TID) | ORAL | 0 refills | Status: DC | PRN

## 2015-09-14 NOTE — Telephone Encounter (Signed)
Okay Xanax 90 and tramadol 60 ---no refills  Received: Today  Message Contents  Wanda PlumpJose E Paz, MD  Conrad BurlingtonKaylyn Danni Shima, CMA

## 2015-09-14 NOTE — Telephone Encounter (Signed)
Rx's faxed to [facility] pharmacy.

## 2015-09-14 NOTE — Telephone Encounter (Signed)
Rx's printed, awaiting MD signature.  

## 2016-01-01 IMAGING — CR DG CHEST 2V
2 series · 2 of 2 positions shown · non-contrast
Comparison: None

CLINICAL DATA: Shortness of breath for years with exertion, mid
chest pain intermittently, heavy smoking history

EXAM:
CHEST  2 VIEW

[w chest pa]
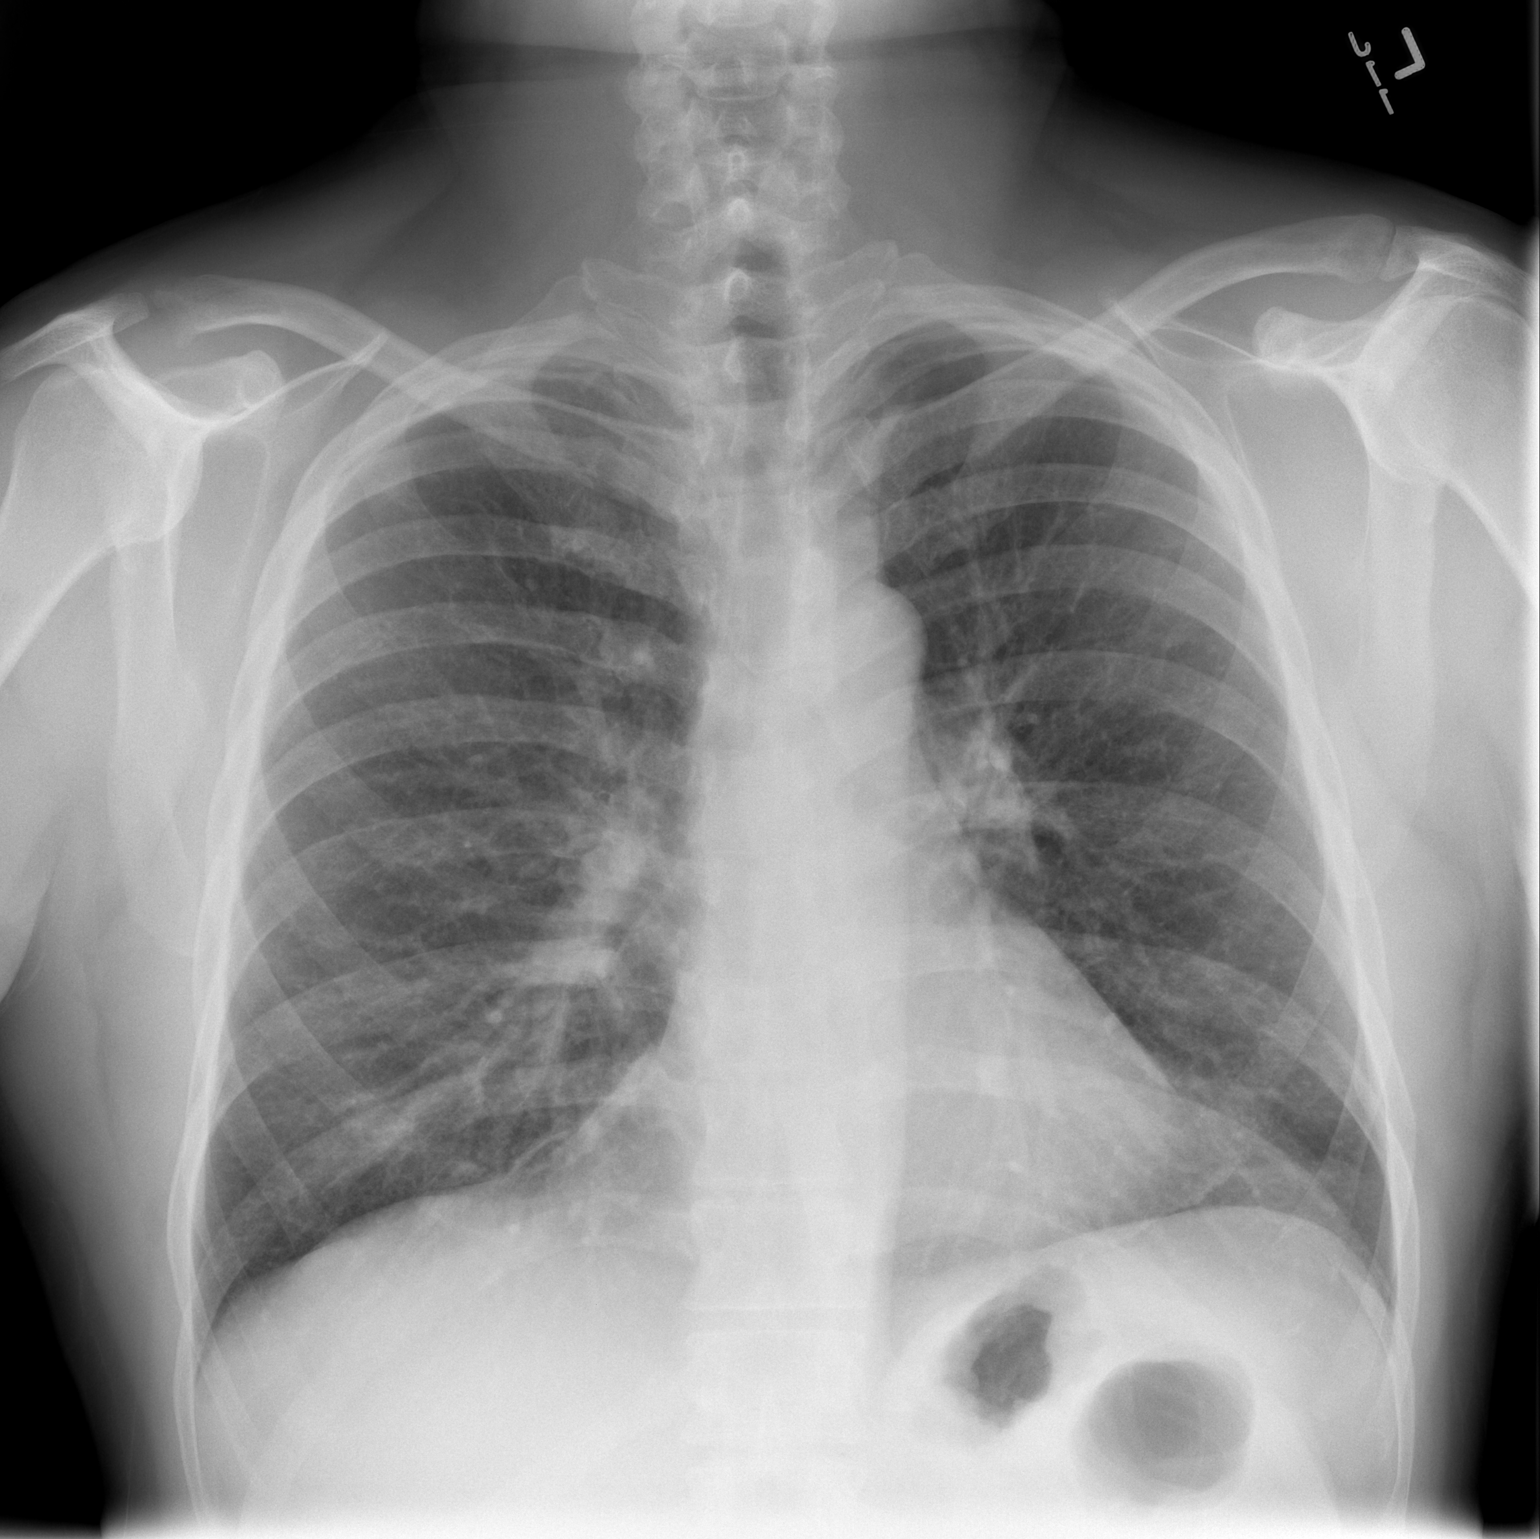

[w chest lat]
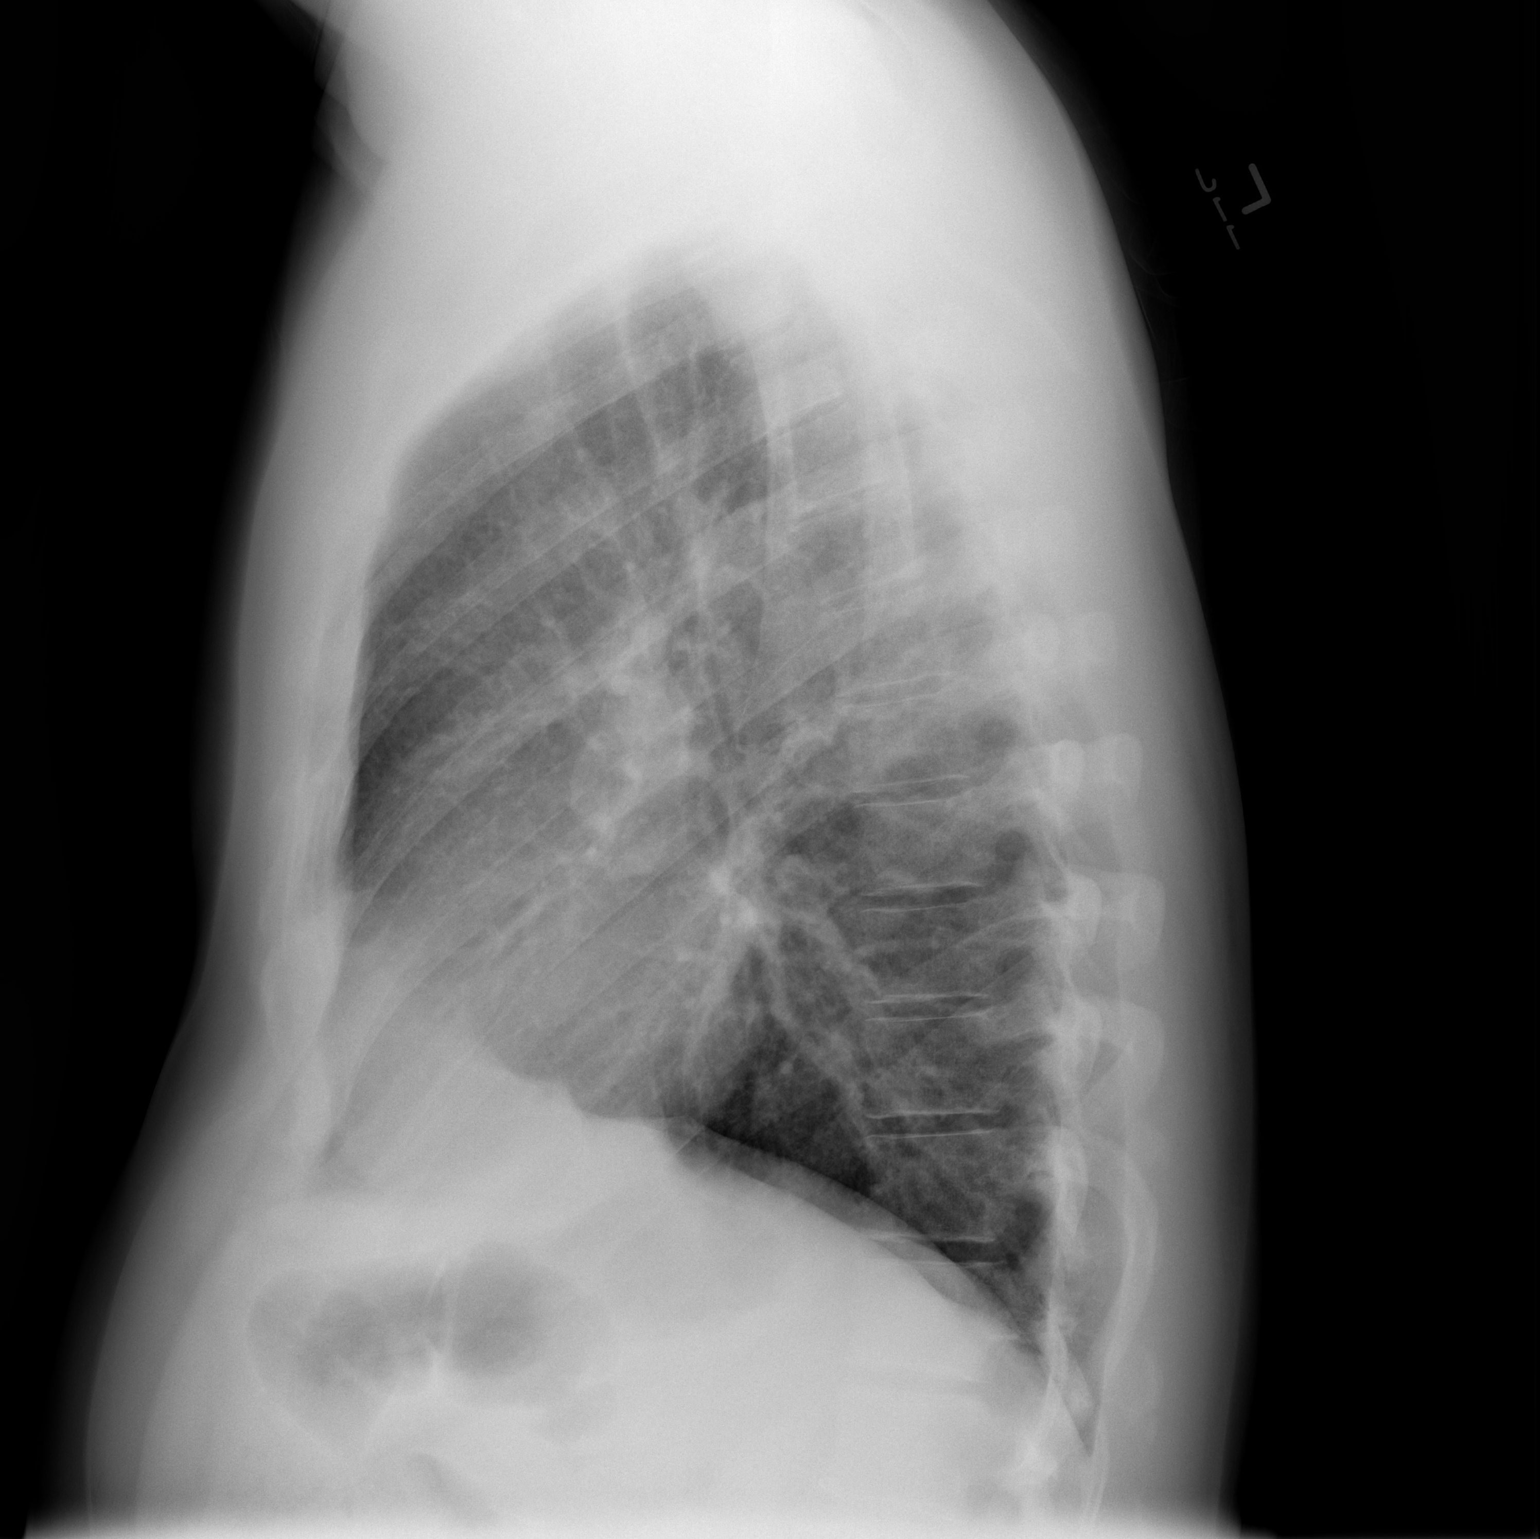

[2 of 2 positions shown; findings below may reference images not displayed]

FINDINGS: Normal heart size, mediastinal contours and pulmonary vascularity.

Lungs clear.

No pleural effusion or pneumothorax.

Question resorption of the distal RIGHT clavicle.
IMPRESSION: No acute cardiopulmonary abnormalities.

Questionable resorption at the distal RIGHT clavicle, nonspecific,
can be seen with hyperparathyroidism, rheumatoid arthritis,
scleroderma, prior trauma, and other etiologies.

## 2016-06-25 ENCOUNTER — Encounter: Payer: Self-pay | Admitting: Internal Medicine

## 2016-06-26 MED ORDER — CYCLOBENZAPRINE HCL 10 MG PO TABS
10.0000 mg | ORAL_TABLET | Freq: Every evening | ORAL | 0 refills | Status: AC | PRN
Start: 2016-06-26 — End: ?

## 2016-06-26 MED ORDER — ALPRAZOLAM 0.5 MG PO TABS: 0.5000 mg | ORAL_TABLET | Freq: Three times a day (TID) | ORAL | 0 refills | Status: AC | PRN

## 2016-06-26 MED ORDER — TRAMADOL HCL 50 MG PO TABS: 50.0000 mg | ORAL_TABLET | Freq: Three times a day (TID) | ORAL | 0 refills | Status: AC | PRN

## 2016-06-26 NOTE — Telephone Encounter (Signed)
Rx's faxed to [facility].
